# Patient Record
Sex: Female | Born: 1945 | Race: White | Hispanic: No | Marital: Married | Smoking: Never smoker
Health system: Southern US, Community
[De-identification: ages and names within clinical notes are randomized; demographics above are authoritative.]

## PROBLEM LIST (undated history)

## (undated) DIAGNOSIS — H409 Unspecified glaucoma: Secondary | ICD-10-CM

## (undated) DIAGNOSIS — E785 Hyperlipidemia, unspecified: Secondary | ICD-10-CM

## (undated) DIAGNOSIS — I10 Essential (primary) hypertension: Secondary | ICD-10-CM

## (undated) DIAGNOSIS — M1990 Unspecified osteoarthritis, unspecified site: Secondary | ICD-10-CM

## (undated) HISTORY — PX: TONSILLECTOMY: SUR1361

## (undated) HISTORY — PX: APPENDECTOMY: SHX54

## (undated) HISTORY — PX: EYE SURGERY: SHX253

## (undated) HISTORY — PX: ABDOMINAL HYSTERECTOMY: SHX81

---

## 2008-05-10 ENCOUNTER — Ambulatory Visit: Payer: Self-pay | Admitting: Diagnostic Radiology

## 2008-05-10 ENCOUNTER — Emergency Department (HOSPITAL_BASED_OUTPATIENT_CLINIC_OR_DEPARTMENT_OTHER): Admission: EM | Admit: 2008-05-10 | Discharge: 2008-05-10 | Payer: Self-pay | Admitting: Emergency Medicine

## 2013-12-12 ENCOUNTER — Encounter (HOSPITAL_COMMUNITY)
Admission: EM | Disposition: A | Discharge status: Home / Self Care | Payer: Self-pay | Source: Home / Self Care | Attending: Emergency Medicine

## 2013-12-12 ENCOUNTER — Encounter (HOSPITAL_BASED_OUTPATIENT_CLINIC_OR_DEPARTMENT_OTHER): Payer: Self-pay | Admitting: Emergency Medicine

## 2013-12-12 ENCOUNTER — Emergency Department (HOSPITAL_BASED_OUTPATIENT_CLINIC_OR_DEPARTMENT_OTHER): Payer: Medicare Other

## 2013-12-12 ENCOUNTER — Observation Stay (HOSPITAL_BASED_OUTPATIENT_CLINIC_OR_DEPARTMENT_OTHER)
Admission: EM | Admit: 2013-12-12 | Discharge: 2013-12-15 | Disposition: A | Discharge status: Home / Self Care | Payer: Medicare Other | Attending: Orthopedic Surgery | Admitting: Orthopedic Surgery

## 2013-12-12 ENCOUNTER — Inpatient Hospital Stay (HOSPITAL_COMMUNITY): Admission: RE | Admit: 2013-12-12 | Payer: Medicare Other | Source: Ambulatory Visit | Admitting: Orthopedic Surgery

## 2013-12-12 ENCOUNTER — Encounter (HOSPITAL_COMMUNITY): Payer: Medicare Other | Admitting: Certified Registered Nurse Anesthetist

## 2013-12-12 ENCOUNTER — Emergency Department (HOSPITAL_COMMUNITY): Payer: Medicare Other | Admitting: Certified Registered Nurse Anesthetist

## 2013-12-12 ENCOUNTER — Emergency Department (HOSPITAL_COMMUNITY): Payer: Medicare Other

## 2013-12-12 DIAGNOSIS — S42493A Other displaced fracture of lower end of unspecified humerus, initial encounter for closed fracture: Secondary | ICD-10-CM | POA: Diagnosis not present

## 2013-12-12 DIAGNOSIS — S52023A Displaced fracture of olecranon process without intraarticular extension of unspecified ulna, initial encounter for closed fracture: Secondary | ICD-10-CM | POA: Diagnosis present

## 2013-12-12 DIAGNOSIS — Z7982 Long term (current) use of aspirin: Secondary | ICD-10-CM | POA: Diagnosis not present

## 2013-12-12 DIAGNOSIS — Z90710 Acquired absence of both cervix and uterus: Secondary | ICD-10-CM | POA: Diagnosis not present

## 2013-12-12 DIAGNOSIS — W010XXA Fall on same level from slipping, tripping and stumbling without subsequent striking against object, initial encounter: Secondary | ICD-10-CM | POA: Insufficient documentation

## 2013-12-12 DIAGNOSIS — M129 Arthropathy, unspecified: Secondary | ICD-10-CM | POA: Insufficient documentation

## 2013-12-12 DIAGNOSIS — Z79899 Other long term (current) drug therapy: Secondary | ICD-10-CM | POA: Insufficient documentation

## 2013-12-12 DIAGNOSIS — I10 Essential (primary) hypertension: Secondary | ICD-10-CM | POA: Diagnosis not present

## 2013-12-12 DIAGNOSIS — Y9389 Activity, other specified: Secondary | ICD-10-CM | POA: Insufficient documentation

## 2013-12-12 DIAGNOSIS — S42402A Unspecified fracture of lower end of left humerus, initial encounter for closed fracture: Secondary | ICD-10-CM | POA: Diagnosis present

## 2013-12-12 DIAGNOSIS — S53449A Ulnar collateral ligament sprain of unspecified elbow, initial encounter: Secondary | ICD-10-CM | POA: Diagnosis not present

## 2013-12-12 DIAGNOSIS — Y92009 Unspecified place in unspecified non-institutional (private) residence as the place of occurrence of the external cause: Secondary | ICD-10-CM | POA: Diagnosis not present

## 2013-12-12 DIAGNOSIS — Z9089 Acquired absence of other organs: Secondary | ICD-10-CM | POA: Insufficient documentation

## 2013-12-12 DIAGNOSIS — E785 Hyperlipidemia, unspecified: Secondary | ICD-10-CM | POA: Diagnosis not present

## 2013-12-12 DIAGNOSIS — S42452A Displaced fracture of lateral condyle of left humerus, initial encounter for closed fracture: Secondary | ICD-10-CM

## 2013-12-12 DIAGNOSIS — S52022A Displaced fracture of olecranon process without intraarticular extension of left ulna, initial encounter for closed fracture: Secondary | ICD-10-CM

## 2013-12-12 DIAGNOSIS — Z882 Allergy status to sulfonamides status: Secondary | ICD-10-CM | POA: Diagnosis not present

## 2013-12-12 DIAGNOSIS — H409 Unspecified glaucoma: Secondary | ICD-10-CM | POA: Insufficient documentation

## 2013-12-12 DIAGNOSIS — Y998 Other external cause status: Secondary | ICD-10-CM | POA: Insufficient documentation

## 2013-12-12 HISTORY — DX: Unspecified glaucoma: H40.9

## 2013-12-12 HISTORY — DX: Unspecified osteoarthritis, unspecified site: M19.90

## 2013-12-12 HISTORY — PX: ORIF ELBOW FRACTURE: SHX5031

## 2013-12-12 HISTORY — DX: Hyperlipidemia, unspecified: E78.5

## 2013-12-12 HISTORY — DX: Essential (primary) hypertension: I10

## 2013-12-12 LAB — COMPREHENSIVE METABOLIC PANEL
ALT: 15 U/L (ref 0–35)
AST: 31 U/L (ref 0–37)
Albumin: 3.8 g/dL (ref 3.5–5.2)
Alkaline Phosphatase: 74 U/L (ref 39–117)
Anion gap: 13 (ref 5–15)
BILIRUBIN TOTAL: 0.4 mg/dL (ref 0.3–1.2)
BUN: 18 mg/dL (ref 6–23)
CHLORIDE: 96 meq/L (ref 96–112)
CO2: 26 meq/L (ref 19–32)
CREATININE: 0.8 mg/dL (ref 0.50–1.10)
Calcium: 10.1 mg/dL (ref 8.4–10.5)
GFR calc Af Amer: 86 mL/min — ABNORMAL LOW (ref >90)
GFR, EST NON AFRICAN AMERICAN: 74 mL/min — AB (ref >90)
Glucose, Bld: 110 mg/dL — ABNORMAL HIGH (ref 70–99)
Potassium: 4.3 mEq/L (ref 3.7–5.3)
Sodium: 135 mEq/L — ABNORMAL LOW (ref 137–147)
Total Protein: 7.1 g/dL (ref 6.0–8.3)

## 2013-12-12 LAB — CBC WITH DIFFERENTIAL/PLATELET
BASOS ABS: 0 10*3/uL (ref 0.0–0.1)
Basophils Relative: 0 % (ref 0–1)
Eosinophils Absolute: 0.9 10*3/uL — ABNORMAL HIGH (ref 0.0–0.7)
Eosinophils Relative: 9 % — ABNORMAL HIGH (ref 0–5)
HEMATOCRIT: 38 % (ref 36.0–46.0)
HEMOGLOBIN: 12.7 g/dL (ref 12.0–15.0)
LYMPHS ABS: 1.7 10*3/uL (ref 0.7–4.0)
LYMPHS PCT: 17 % (ref 12–46)
MCH: 30.9 pg (ref 26.0–34.0)
MCHC: 33.4 g/dL (ref 30.0–36.0)
MCV: 92.5 fL (ref 78.0–100.0)
MONO ABS: 0.7 10*3/uL (ref 0.1–1.0)
MONOS PCT: 7 % (ref 3–12)
NEUTROS ABS: 7 10*3/uL (ref 1.7–7.7)
Neutrophils Relative %: 67 % (ref 43–77)
Platelets: 261 10*3/uL (ref 150–400)
RBC: 4.11 MIL/uL (ref 3.87–5.11)
RDW: 12.6 % (ref 11.5–15.5)
WBC: 10.3 10*3/uL (ref 4.0–10.5)

## 2013-12-12 LAB — PROTIME-INR
INR: 0.95 (ref 0.00–1.49)
PROTHROMBIN TIME: 12.7 s (ref 11.6–15.2)

## 2013-12-12 SURGERY — OPEN REDUCTION INTERNAL FIXATION (ORIF) ELBOW/OLECRANON FRACTURE
Anesthesia: General | Site: Elbow | Laterality: Left

## 2013-12-12 MED ORDER — MORPHINE SULFATE 4 MG/ML IJ SOLN
4.0000 mg | INTRAMUSCULAR | Status: DC | PRN
  Administered 2013-12-12: 4 mg via INTRAVENOUS
  Filled 2013-12-12: qty 1

## 2013-12-12 MED ORDER — OXYCODONE-ACETAMINOPHEN 5-325 MG PO TABS
2.0000 | ORAL_TABLET | Freq: Once | ORAL | Status: AC
  Administered 2013-12-12: 2 via ORAL
  Filled 2013-12-12: qty 2

## 2013-12-12 MED ORDER — PROPOFOL 10 MG/ML IV BOLUS
INTRAVENOUS | Status: DC | PRN
  Administered 2013-12-12: 120 mg via INTRAVENOUS

## 2013-12-12 MED ORDER — MIDAZOLAM HCL 2 MG/2ML IJ SOLN
INTRAMUSCULAR | Status: DC | PRN
  Administered 2013-12-12: 2 mg via INTRAVENOUS

## 2013-12-12 MED ORDER — EPHEDRINE SULFATE 50 MG/ML IJ SOLN
INTRAMUSCULAR | Status: DC | PRN
  Administered 2013-12-12 (×2): 10 mg via INTRAVENOUS

## 2013-12-12 MED ORDER — FENTANYL CITRATE 0.05 MG/ML IJ SOLN
INTRAMUSCULAR | Status: DC | PRN
  Administered 2013-12-12: 100 ug via INTRAVENOUS
  Administered 2013-12-12: 50 ug via INTRAVENOUS

## 2013-12-12 MED ORDER — MIDAZOLAM HCL 2 MG/2ML IJ SOLN
INTRAMUSCULAR | Status: AC
  Filled 2013-12-12: qty 2

## 2013-12-12 MED ORDER — 0.9 % SODIUM CHLORIDE (POUR BTL) OPTIME
TOPICAL | Status: DC | PRN
  Administered 2013-12-12: 1000 mL

## 2013-12-12 MED ORDER — SUCCINYLCHOLINE CHLORIDE 20 MG/ML IJ SOLN
INTRAMUSCULAR | Status: DC | PRN
  Administered 2013-12-12: 120 mg via INTRAVENOUS

## 2013-12-12 MED ORDER — CEFAZOLIN SODIUM-DEXTROSE 2-3 GM-% IV SOLR
INTRAVENOUS | Status: DC | PRN
  Administered 2013-12-12: 2 g via INTRAVENOUS

## 2013-12-12 MED ORDER — LIDOCAINE HCL (CARDIAC) 20 MG/ML IV SOLN
INTRAVENOUS | Status: DC | PRN
  Administered 2013-12-12: 100 mg via INTRAVENOUS

## 2013-12-12 MED ORDER — BUPIVACAINE-EPINEPHRINE (PF) 0.5% -1:200000 IJ SOLN
INTRAMUSCULAR | Status: DC | PRN
  Administered 2013-12-12: 30 mL via PERINEURAL

## 2013-12-12 MED ORDER — FENTANYL CITRATE 0.05 MG/ML IJ SOLN
INTRAMUSCULAR | Status: AC
  Filled 2013-12-12: qty 5

## 2013-12-12 SURGICAL SUPPLY — 80 items
ANCHOR JUGGERKNOT 1.0 1DR 2-0 (Anchor) ×6 IMPLANT
BANDAGE ELASTIC 3 VELCRO ST LF (GAUZE/BANDAGES/DRESSINGS) ×3 IMPLANT
BANDAGE ELASTIC 4 VELCRO ST LF (GAUZE/BANDAGES/DRESSINGS) ×6 IMPLANT
BIT DRILL 2.0 LNG QUCK RELEASE (BIT) ×1 IMPLANT
BIT DRILL 2.8 QUICK RELEASE (BIT) ×1 IMPLANT
BIT DRILL MICR ACTRK 2 LNG PRF (BIT) ×1 IMPLANT
BIT DRILL MINI LNG ACUTRAK 2 (BIT) ×1 IMPLANT
BNDG COHESIVE 4X5 TAN STRL (GAUZE/BANDAGES/DRESSINGS) ×3 IMPLANT
BNDG ESMARK 4X9 LF (GAUZE/BANDAGES/DRESSINGS) ×3 IMPLANT
BNDG GAUZE ELAST 4 BULKY (GAUZE/BANDAGES/DRESSINGS) ×6 IMPLANT
CORDS BIPOLAR (ELECTRODE) ×3 IMPLANT
COVER MAYO STAND STRL (DRAPES) ×3 IMPLANT
COVER SURGICAL LIGHT HANDLE (MISCELLANEOUS) ×3 IMPLANT
CUFF TOURNIQUET SINGLE 18IN (TOURNIQUET CUFF) ×3 IMPLANT
CUFF TOURNIQUET SINGLE 24IN (TOURNIQUET CUFF) IMPLANT
DRAPE INCISE IOBAN 66X45 STRL (DRAPES) ×3 IMPLANT
DRAPE OEC MINIVIEW 54X84 (DRAPES) IMPLANT
DRILL 2.0 LNG QUICK RELEASE (BIT) ×3
DRILL 2.8 QUICK RELEASE (BIT) ×3
DRILL MICRO ACUTRAK 2 LNG PROF (BIT) ×3
DRILL MINI LNG ACUTRAK 2 (BIT) ×3
DRSG ADAPTIC 3X8 NADH LF (GAUZE/BANDAGES/DRESSINGS) ×3 IMPLANT
EVACUATOR 1/8 PVC DRAIN (DRAIN) ×3 IMPLANT
GAUZE SPONGE 4X4 12PLY STRL (GAUZE/BANDAGES/DRESSINGS) ×3 IMPLANT
GAUZE XEROFORM 1X8 LF (GAUZE/BANDAGES/DRESSINGS) ×3 IMPLANT
GAUZE XEROFORM 5X9 LF (GAUZE/BANDAGES/DRESSINGS) ×3 IMPLANT
GLOVE BIO SURGEON STRL SZ7.5 (GLOVE) ×3 IMPLANT
GLOVE BIOGEL M STRL SZ7.5 (GLOVE) ×3 IMPLANT
GLOVE BIOGEL PI IND STRL 6.5 (GLOVE) ×1 IMPLANT
GLOVE BIOGEL PI INDICATOR 6.5 (GLOVE) ×2
GLOVE ECLIPSE 7.5 STRL STRAW (GLOVE) ×3 IMPLANT
GLOVE SS BIOGEL STRL SZ 8 (GLOVE) ×1 IMPLANT
GLOVE SUPERSENSE BIOGEL SZ 8 (GLOVE) ×2
GOWN STRL REUS W/ TWL LRG LVL3 (GOWN DISPOSABLE) ×2 IMPLANT
GOWN STRL REUS W/ TWL XL LVL3 (GOWN DISPOSABLE) ×3 IMPLANT
GOWN STRL REUS W/TWL LRG LVL3 (GOWN DISPOSABLE) ×4
GOWN STRL REUS W/TWL XL LVL3 (GOWN DISPOSABLE) ×6
GUIDEWIRE ORTH 6X062XTROC NS (WIRE) ×1 IMPLANT
GUIDEWIRE ORTHO MICROSHT  ACUT (WIRE) ×4
GUIDEWIRE ORTHO MICROSHT .035 (WIRE) ×2 IMPLANT
GUIDEWIRE ORTHO MINI ACTK .045 (WIRE) ×3 IMPLANT
K-WIRE .062 (WIRE) ×2
KIT BASIN OR (CUSTOM PROCEDURE TRAY) ×3 IMPLANT
KIT ROOM TURNOVER OR (KITS) ×3 IMPLANT
LOOP VESSEL MAXI BLUE (MISCELLANEOUS) IMPLANT
MANIFOLD NEPTUNE II (INSTRUMENTS) ×3 IMPLANT
NEEDLE HYPO 25GX1X1/2 BEV (NEEDLE) IMPLANT
NS IRRIG 1000ML POUR BTL (IV SOLUTION) ×3 IMPLANT
PACK ORTHO EXTREMITY (CUSTOM PROCEDURE TRAY) ×3 IMPLANT
PAD ARMBOARD 7.5X6 YLW CONV (MISCELLANEOUS) ×6 IMPLANT
PAD CAST 4YDX4 CTTN HI CHSV (CAST SUPPLIES) ×3 IMPLANT
PADDING CAST COTTON 4X4 STRL (CAST SUPPLIES) ×6
PLATE LEFT OLECRANON 3HOLE (Plate) ×3 IMPLANT
PUTTY DBM STAGRAFT PLUS 2CC (Putty) ×3 IMPLANT
SCREW ACUTRAK 2 MICRO 14MM (Screw) ×3 IMPLANT
SCREW ACUTRAK 2 MINI 24MM (Screw) ×3 IMPLANT
SCREW ACUTRAK 2 MINI 26MM (Screw) ×3 IMPLANT
SCREW CORTICAL 3.5X20MM (Screw) ×3 IMPLANT
SCREW HEXALOBE LOCKING 3.5X14M (Screw) ×3 IMPLANT
SCREW LOCK 22X2.7X HEXALOBE (Screw) ×1 IMPLANT
SCREW LOCK 40X3.5X HEXALOBE (Screw) ×1 IMPLANT
SCREW LOCKING 2.7X22MM (Screw) ×2 IMPLANT
SCREW LOCKING 3.5X40 (Screw) ×2 IMPLANT
SCREW LOCKING HEX 3.5X26MM (Screw) ×3 IMPLANT
SCREW NON LOCKING HEX 2.7X20MM (Screw) ×3 IMPLANT
SOLUTION BETADINE 4OZ (MISCELLANEOUS) ×3 IMPLANT
SPECIMEN JAR SMALL (MISCELLANEOUS) ×3 IMPLANT
SPLINT FIBERGLASS 4X30 (CAST SUPPLIES) ×3 IMPLANT
SPONGE GAUZE 4X4 12PLY STER LF (GAUZE/BANDAGES/DRESSINGS) ×3 IMPLANT
SPONGE LAP 18X18 X RAY DECT (DISPOSABLE) ×3 IMPLANT
SPONGE SCRUB IODOPHOR (GAUZE/BANDAGES/DRESSINGS) ×3 IMPLANT
SUT PROLENE 3 0 PS 2 (SUTURE) ×12 IMPLANT
SUT VIC AB 3-0 FS2 27 (SUTURE) ×6 IMPLANT
SYR CONTROL 10ML LL (SYRINGE) IMPLANT
TOWEL OR 17X24 6PK STRL BLUE (TOWEL DISPOSABLE) ×3 IMPLANT
TOWEL OR 17X26 10 PK STRL BLUE (TOWEL DISPOSABLE) ×9 IMPLANT
TUBE CONNECTING 12'X1/4 (SUCTIONS)
TUBE CONNECTING 12X1/4 (SUCTIONS) IMPLANT
UNDERPAD 30X30 INCONTINENT (UNDERPADS AND DIAPERS) ×3 IMPLANT
WATER STERILE IRR 1000ML POUR (IV SOLUTION) ×3 IMPLANT

## 2013-12-12 NOTE — ED Notes (Signed)
Pt c/o fall from standing landing on concrete injuring left elbow x 30 mins ago.

## 2013-12-12 NOTE — ED Notes (Signed)
Dr Gramig at bedside. 

## 2013-12-12 NOTE — ED Notes (Signed)
Report given to Charge RN at [facility] ED

## 2013-12-12 NOTE — H&P (Signed)
Deanna Compton is an 68 y.o. female.   Chief Complaint: Patient presents with a left elbow comminuted complex fracture  HPI: Patient presents with a comminuted left elbow fracture with displacement. She denies other complaints. This was a same level fall today. She denies locking popping catching numbness or tingling in the wrist or shoulder. She denies prior injury to the elbow. She denies lower extremity pain.  She specifically denies neck back chest or abdominal pain. She is here with her husband. She is a healthy female for the most part.  Past Medical History  Diagnosis Date  . Hyperlipemia   . Arthritis   . Glaucoma   . Hypertension     Past Surgical History  Procedure Laterality Date  . Appendectomy    . Tonsillectomy    . Abdominal hysterectomy    . Eye surgery      History reviewed. No pertinent family history. Social History:  reports that she has never smoked. She does not have any smokeless tobacco history on file. She reports that she does not drink alcohol or use illicit drugs.  Allergies:  Allergies  Allergen Reactions  . Sulfa Antibiotics Other (See Comments)    Childhood allergy.    Medications Prior to Admission  Medication Sig Dispense Refill  . aspirin 325 MG tablet Take 325 mg by mouth daily.      Marland Kitchen atorvastatin (LIPITOR) 40 MG tablet Take 40 mg by mouth daily.      . bimatoprost (LUMIGAN) 0.03 % ophthalmic solution Place 1 drop into both eyes at bedtime.       . calcium carbonate (OS-CAL - DOSED IN MG OF ELEMENTAL CALCIUM) 1250 MG tablet Take 1 tablet by mouth daily with breakfast.      . cholecalciferol (VITAMIN D) 1000 UNITS tablet Take 1,000 Units by mouth daily.      . dorzolamide-timolol (COSOPT) 22.3-6.8 MG/ML ophthalmic solution Place 1 drop into both eyes 2 (two) times daily.      . Estradiol (VAGIFEM) 10 MCG TABS vaginal tablet Place 1 tablet vaginally every Monday, Wednesday, and Friday.      . meloxicam (MOBIC) 7.5 MG tablet Take 15 mg by mouth  daily.       . Multiple Vitamin (MULTIVITAMIN WITH MINERALS) TABS tablet Take 1 tablet by mouth daily.      Marland Kitchen omega-3 acid ethyl esters (LOVAZA) 1 G capsule Take 1 g by mouth 3 (three) times daily.      . polyethylene glycol (MIRALAX / GLYCOLAX) packet Take 17 g by mouth daily.      . solifenacin (VESICARE) 5 MG tablet Take 2.5 mg by mouth daily.       . TURMERIC CURCUMIN PO Take 1 capsule by mouth 3 (three) times daily.        Results for orders placed during the hospital encounter of 12/12/13 (from the past 48 hour(s))  CBC WITH DIFFERENTIAL     Status: Abnormal   Collection Time    12/12/13  3:55 PM      Result Value Ref Range   WBC 10.3  4.0 - 10.5 K/uL   RBC 4.11  3.87 - 5.11 MIL/uL   Hemoglobin 12.7  12.0 - 15.0 g/dL   HCT 38.0  36.0 - 46.0 %   MCV 92.5  78.0 - 100.0 fL   MCH 30.9  26.0 - 34.0 pg   MCHC 33.4  30.0 - 36.0 g/dL   RDW 12.6  11.5 - 15.5 %   Platelets  261  150 - 400 K/uL   Neutrophils Relative % 67  43 - 77 %   Neutro Abs 7.0  1.7 - 7.7 K/uL   Lymphocytes Relative 17  12 - 46 %   Lymphs Abs 1.7  0.7 - 4.0 K/uL   Monocytes Relative 7  3 - 12 %   Monocytes Absolute 0.7  0.1 - 1.0 K/uL   Eosinophils Relative 9 (*) 0 - 5 %   Eosinophils Absolute 0.9 (*) 0.0 - 0.7 K/uL   Basophils Relative 0  0 - 1 %   Basophils Absolute 0.0  0.0 - 0.1 K/uL  COMPREHENSIVE METABOLIC PANEL     Status: Abnormal   Collection Time    12/12/13  3:55 PM      Result Value Ref Range   Sodium 135 (*) 137 - 147 mEq/L   Potassium 4.3  3.7 - 5.3 mEq/L   Chloride 96  96 - 112 mEq/L   CO2 26  19 - 32 mEq/L   Glucose, Bld 110 (*) 70 - 99 mg/dL   BUN 18  6 - 23 mg/dL   Creatinine, Ser 0.80  0.50 - 1.10 mg/dL   Calcium 10.1  8.4 - 10.5 mg/dL   Total Protein 7.1  6.0 - 8.3 g/dL   Albumin 3.8  3.5 - 5.2 g/dL   AST 31  0 - 37 U/L   ALT 15  0 - 35 U/L   Alkaline Phosphatase 74  39 - 117 U/L   Total Bilirubin 0.4  0.3 - 1.2 mg/dL   GFR calc non Af Amer 74 (*) >90 mL/min   GFR calc Af Amer 86  (*) >90 mL/min   Comment: (NOTE)     The eGFR has been calculated using the CKD EPI equation.     This calculation has not been validated in all clinical situations.     eGFR's persistently <90 mL/min signify possible Chronic Kidney     Disease.   Anion gap 13  5 - 15  PROTIME-INR     Status: None   Collection Time    12/12/13  3:55 PM      Result Value Ref Range   Prothrombin Time 12.7  11.6 - 15.2 seconds   INR 0.95  0.00 - 1.49   Dg Chest 1 View  12/12/2013   CLINICAL DATA:  Preoperative evaluation.  EXAM: CHEST - 1 VIEW  COMPARISON:  None.  FINDINGS: The cardiac silhouette appears enlarged, mediastinal silhouette is unremarkable. No pleural effusions. Strandy densities left lung base. No pneumothorax. Soft tissue planes and included osseous structures are nonsuspicious.  IMPRESSION: Mild cardiomegaly.  Left lung base atelectasis.   Electronically Signed   By: Elon Alas   On: 12/12/2013 19:59   Dg Elbow Complete Left  12/12/2013   CLINICAL DATA:  Status post fall.  Posterior elbow pain.  EXAM: LEFT ELBOW - COMPLETE 3+ VIEW  COMPARISON:  None.  FINDINGS: The patient has a minimally distracted fracture of the olecranon. There is also a fracture of the capitellum which is superiorly displaced along the distal humerus approximately 1.5 cm. Elbow joint effusion is noted.  IMPRESSION: Acute, mildly distracted olecranon fracture and superiorly displaced capitellum fracture.   Electronically Signed   By: Inge Rise M.D.   On: 12/12/2013 14:15    Review of Systems  Constitutional: Negative.   Eyes: Negative.   Respiratory: Negative.   Cardiovascular: Negative.   Gastrointestinal: Negative.   Genitourinary: Negative.  Neurological: Negative.   Psychiatric/Behavioral: Negative.     Blood pressure 127/57, pulse 55, temperature 98.3 F (36.8 C), temperature source Oral, resp. rate 12, height 5' 3"  (1.6 m), weight 63.504 kg (140 lb), SpO2 96.00%. Physical Exam  White female alert  and oriented. Swollen elbow. She has pain in the elbow appropriate to her injury.  She has intact pulse and refill to the hand. She has intact sensation to the hand and can extend her fingers. There is no signs of obvious compartment syndrome at this juncture.  The patient's neck and back are nontender.  The patient is alert and oriented in no acute distress the patient complains of pain in the affected upper extremity.  The patient is noted to have a normal HEENT exam.  Lung fields show equal chest expansion and no shortness of breath  abdomen exam is nontender without distention.  Lower extremity examination does not show any fracture dislocation or blood clot symptoms.  Pelvis is stable neck and back are stable and nontender   Assessment/Plan Comminuted left elbow fracture with capitellum trochlea and olecranon injuries  I would recommend open reduction internal fixation with repair reconstruction is necessary. I discussed the patient the risk and benefits duties and don'ts and timeframe duration of recovery.  I have discussed with the patient the plans.  Unfortunately this is a devastating left elbow injury. We're going to do our best to find functional range of motion in terms of her outcome and bony healing.  I discussed the patient all issues risk and benefits and she desires to proceed. I personally reviewed the x-rays at great length and discussed she and her husband the relevant issues and pathophysiology  We are planning surgery for your upper extremity. The risk and benefits of surgery include risk of bleeding infection anesthesia damage to normal structures and failure of the surgery to accomplish its intended goals of relieving symptoms and restoring function with this in mind we'll going to proceed. I have specifically discussed with the patient the pre-and postoperative regime and the does and don'ts and risk and benefits in great detail. Risk and benefits of surgery also  include risk of dystrophy chronic nerve pain failure of the healing process to go onto completion and other inherent risks of surgery The relavent the pathophysiology of the disease/injury process, as well as the alternatives for treatment and postoperative course of action has been discussed in great detail with the patient who desires to proceed.  We will do everything in our power to help you (the patient) restore function to the upper extremity. Is a pleasure to see this patient today.   Paulene Floor 12/12/2013, 8:56 PM

## 2013-12-12 NOTE — ED Notes (Signed)
Dr. Amanda Pea called to inform of patient arrival.

## 2013-12-12 NOTE — ED Notes (Signed)
Dr Silverio Lay aware unable to start SL, but blood was drawn. Dr Silverio Lay states okay to leave IV out for now,

## 2013-12-12 NOTE — ED Provider Notes (Signed)
CSN: [id number]     Arrival date & time 12/12/13  1349 History   First MD Initiated Contact with Patient 12/12/13 1501     Chief Complaint  Patient presents with  . Arm Injury     (Consider location/radiation/quality/duration/timing/severity/associated sxs/prior Treatment) The history is provided by the patient.  Deanna Compton is a 68 y.o. female hx HL, HTN here with fall. She was carrying groceries today and her foot got caught and she fell and hit left elbow. Denies head injury or other injuries. Was unable to move L elbow afterwards. Denies other extremity pain or back pain. Not on blood thinners.     Past Medical History  Diagnosis Date  . Hyperlipemia   . Arthritis   . Glaucoma   . Hypertension    Past Surgical History  Procedure Laterality Date  . Appendectomy    . Tonsillectomy    . Abdominal hysterectomy    . Eye surgery     History reviewed. No pertinent family history. History  Substance Use Topics  . Smoking status: Never Smoker   . Smokeless tobacco: Not on file  . Alcohol Use: No   OB History   Grav Para Term Preterm Abortions TAB SAB Ect Mult Living                 Review of Systems  Musculoskeletal:       L elbow pain   All other systems reviewed and are negative.     Allergies  Sulfa antibiotics  Home Medications   Prior to Admission medications   Medication Sig Start Date End Date Taking? Authorizing Provider  aspirin 325 MG tablet Take 325 mg by mouth daily.   Yes Historical Provider, MD  atorvastatin (LIPITOR) 40 MG tablet Take 40 mg by mouth daily.   Yes Historical Provider, MD  bimatoprost (LUMIGAN) 0.03 % ophthalmic solution 1 drop at bedtime.   Yes Historical Provider, MD  meloxicam (MOBIC) 7.5 MG tablet Take 7.5 mg by mouth daily.   Yes Historical Provider, MD  pseudoephedrine-acetaminophen (TYLENOL SINUS) 30-500 MG TABS Take 1 tablet by mouth every 4 (four) hours as needed.   Yes Historical Provider, MD  solifenacin (VESICARE) 5 MG  tablet Take 5 mg by mouth daily.   Yes Historical Provider, MD  timolol (BETIMOL) 0.5 % ophthalmic solution 1 drop 2 (two) times daily.   Yes Historical Provider, MD   BP 153/85  Pulse 63  Temp(Src) 98.1 F (36.7 C)  Resp 16  Ht  (1.6 m)  Wt 140 lb (63.504 kg)  BMI 24.81 kg/m2  SpO2 98% Physical Exam  Nursing note and vitals reviewed. Constitutional: She is oriented to person, place, and time. She appears well-nourished.  Uncomfortable   HENT:  Head: Normocephalic and atraumatic.  Eyes: Conjunctivae are normal. Pupils are equal, round, and reactive to light.  Neck: Normal range of motion. Neck supple.  Cardiovascular: Normal rate.   Pulmonary/Chest: Effort normal.  Abdominal: Soft.  Musculoskeletal: Normal range of motion.  L elbow slightly deformed, dec ROM. Tenderness on the elbow. No shoulder or humerus or distal forearm tenderness. Nl ROM wrist, nl hand grasp, nl capillary refill. 2+ pulses   Neurological: She is alert and oriented to person, place, and time.  Skin: Skin is warm.  Psychiatric: She has a normal mood and affect. Her behavior is normal. Judgment and thought content normal.    ED Course  Procedures (including critical care time) Labs Review Labs Reviewed - No data  to display  Imaging Review Dg Elbow Complete Left  12/12/2013   CLINICAL DATA:  Status post fall.  Posterior elbow pain.  EXAM: LEFT ELBOW - COMPLETE 3+ VIEW  COMPARISON:  None.  FINDINGS: The patient has a minimally distracted fracture of the olecranon. There is also a fracture of the capitellum which is superiorly displaced along the distal humerus approximately 1.5 cm. Elbow joint effusion is noted.  IMPRESSION: Acute, mildly distracted olecranon fracture and superiorly displaced capitellum fracture.   Electronically Signed   By: Drusilla Kanner M.D.   On: 12/12/2013 14:15     EKG Interpretation None      MDM   Final diagnoses:  Olecranon fracture, left, closed, initial encounter   Closed fracture of capitellum of distal humerus, left, initial encounter    Deanna Compton is a 68 y.o. female here with fall. Xray elbow showed olecranon fracture and capitellum fracture. She said that Dr. Amanda Pea is her hand surgeon. We called Dr. Amanda Pea, who plans on doing surgery today. Request transfer to [facility]. Dr. Patria Mane in the ED made aware. Preop labs drawn. Place in splint and shoulder immobilizer.    Richardean Canal, MD 12/12/13 [phone]

## 2013-12-12 NOTE — Anesthesia Procedure Notes (Addendum)
Anesthesia Regional Block:  Supraclavicular block  Pre-Anesthetic Checklist: ,, timeout performed, Correct Patient, Correct Site, Correct Laterality, Correct Procedure, Correct Position, site marked, Risks and benefits discussed,  Surgical consent,  Pre-op evaluation,  At surgeon's request and post-op pain management  Laterality: Left  Prep: chloraprep       Needles:  Injection technique: Single-shot  Needle Type: Echogenic Stimulator Needle     Needle Length: 9cm 9 cm Needle Gauge: 21 and 21 G    Additional Needles:  Procedures: ultrasound guided (picture in chart) and nerve stimulator Supraclavicular block  Nerve Stimulator or Paresthesia:  Response: 0.4 mA,   Additional Responses:   Narrative:  Start time: 12/12/2013 8:30 PM End time: 12/12/2013 8:40 PM Injection made incrementally with aspirations every 5 mL.  Performed by: Personally  Anesthesiologist: Arta Bruce MD  Additional Notes: Monitors applied. Patient sedated. Sterile prep and drape,hand hygiene and sterile gloves were used. Relevant anatomy identified.Needle position confirmed.Local anesthetic injected incrementally after negative aspiration. Local anesthetic spread visualized around nerve(s). Vascular puncture avoided. No complications. Image printed for medical record.The patient tolerated the procedure well.        Procedure Name: Intubation Date/Time: 12/12/2013 10:38 PM Performed by: Molli Hazard Pre-anesthesia Checklist: Patient identified, Emergency Drugs available, Suction available and Patient being monitored Patient Re-evaluated:Patient Re-evaluated prior to inductionOxygen Delivery Method: Circle system utilized Preoxygenation: Pre-oxygenation with 100% oxygen Intubation Type: IV induction, Rapid sequence and Cricoid Pressure applied Laryngoscope Size: Miller and 2 Grade View: Grade I Tube type: Oral Tube size: 7.5 mm Number of attempts: 1 Airway Equipment and Method: Stylet Placement  Confirmation: ETT inserted through vocal cords under direct vision,  positive ETCO2 and breath sounds checked- equal and bilateral Secured at: 22 cm Tube secured with: Tape Dental Injury: Teeth and Oropharynx as per pre-operative assessment

## 2013-12-12 NOTE — Anesthesia Preprocedure Evaluation (Addendum)
Anesthesia Evaluation  Patient identified by MRN, date of birth, ID band Patient awake    Reviewed: Allergy & Precautions, H&P , NPO status , Patient's Chart, lab work & pertinent test results  Airway Mallampati: I TM Distance: >3 FB Neck ROM: Full    Dental  (+) Teeth Intact, Dental Advisory Given   Pulmonary          Cardiovascular hypertension,     Neuro/Psych    GI/Hepatic   Endo/Other    Renal/GU      Musculoskeletal   Abdominal   Peds  Hematology   Anesthesia Other Findings   Reproductive/Obstetrics                          Anesthesia Physical Anesthesia Plan  ASA: II  Anesthesia Plan: General   Post-op Pain Management:    Induction: Intravenous  Airway Management Planned: Oral ETT  Additional Equipment:   Intra-op Plan:   Post-operative Plan: Extubation in OR  Informed Consent:   Dental advisory given  Plan Discussed with: Surgeon and CRNA  Anesthesia Plan Comments:        Anesthesia Quick Evaluation

## 2013-12-12 NOTE — ED Provider Notes (Signed)
On arrival from our affiliated facility the patient is in no distress, pain is well-controlled with previously provided medication.  Specialist is aware.  Gerhard Munch, MD 12/12/13 [phone]

## 2013-12-12 NOTE — ED Notes (Signed)
Notified Dr. Amanda Pea of patient's arrival

## 2013-12-13 DIAGNOSIS — S42402A Unspecified fracture of lower end of left humerus, initial encounter for closed fracture: Secondary | ICD-10-CM | POA: Diagnosis present

## 2013-12-13 MED ORDER — MEPERIDINE HCL 25 MG/ML IJ SOLN: 6.2500 mg | INTRAMUSCULAR | Status: DC | PRN

## 2013-12-13 MED ORDER — CEFAZOLIN SODIUM 1-5 GM-% IV SOLN
1.0000 g | INTRAVENOUS | Status: AC
  Administered 2013-12-13: 1 g via INTRAVENOUS
  Filled 2013-12-13: qty 50

## 2013-12-13 MED ORDER — ONDANSETRON HCL 4 MG/2ML IJ SOLN
INTRAMUSCULAR | Status: DC | PRN
  Administered 2013-12-13: 4 mg via INTRAVENOUS

## 2013-12-13 MED ORDER — METHOCARBAMOL 500 MG PO TABS
500.0000 mg | ORAL_TABLET | Freq: Four times a day (QID) | ORAL | Status: DC | PRN
  Administered 2013-12-13 – 2013-12-15 (×6): 500 mg via ORAL
  Filled 2013-12-13 (×6): qty 1

## 2013-12-13 MED ORDER — FAMOTIDINE 20 MG PO TABS: 20.0000 mg | ORAL_TABLET | Freq: Two times a day (BID) | ORAL | Status: DC | PRN

## 2013-12-13 MED ORDER — ASPIRIN 325 MG PO TABS
325.0000 mg | ORAL_TABLET | Freq: Every day | ORAL | Status: DC
  Administered 2013-12-13 – 2013-12-15 (×3): 325 mg via ORAL
  Filled 2013-12-13 (×3): qty 1

## 2013-12-13 MED ORDER — ONDANSETRON HCL 4 MG/2ML IJ SOLN
4.0000 mg | Freq: Four times a day (QID) | INTRAMUSCULAR | Status: DC | PRN
  Administered 2013-12-14 – 2013-12-15 (×3): 4 mg via INTRAVENOUS
  Filled 2013-12-13 (×3): qty 2

## 2013-12-13 MED ORDER — DIPHENHYDRAMINE HCL 50 MG/ML IJ SOLN
INTRAMUSCULAR | Status: DC | PRN
  Administered 2013-12-13: 12.5 mg via INTRAVENOUS

## 2013-12-13 MED ORDER — ONDANSETRON HCL 4 MG/2ML IJ SOLN: 4.0000 mg | Freq: Once | INTRAMUSCULAR | Status: DC | PRN

## 2013-12-13 MED ORDER — OXYCODONE HCL 5 MG PO TABS
5.0000 mg | ORAL_TABLET | ORAL | Status: DC | PRN
  Administered 2013-12-13: 5 mg via ORAL
  Administered 2013-12-13: 10 mg via ORAL
  Administered 2013-12-13: 5 mg via ORAL
  Administered 2013-12-14: 10 mg via ORAL
  Administered 2013-12-14: 5 mg via ORAL
  Administered 2013-12-14 – 2013-12-15 (×4): 10 mg via ORAL
  Filled 2013-12-13: qty 1
  Filled 2013-12-13 (×3): qty 2
  Filled 2013-12-13: qty 1
  Filled 2013-12-13 (×2): qty 2
  Filled 2013-12-13: qty 1
  Filled 2013-12-13: qty 2

## 2013-12-13 MED ORDER — METHOCARBAMOL 1000 MG/10ML IJ SOLN: 500.0000 mg | Freq: Four times a day (QID) | INTRAVENOUS | Status: DC | PRN

## 2013-12-13 MED ORDER — HYDROMORPHONE HCL PF 1 MG/ML IJ SOLN: 0.2500 mg | INTRAMUSCULAR | Status: DC | PRN

## 2013-12-13 MED ORDER — MORPHINE SULFATE 4 MG/ML IJ SOLN
4.0000 mg | INTRAMUSCULAR | Status: DC | PRN
  Administered 2013-12-13 – 2013-12-14 (×4): 4 mg via INTRAVENOUS
  Filled 2013-12-13 (×4): qty 1

## 2013-12-13 MED ORDER — PROMETHAZINE HCL 25 MG RE SUPP
12.5000 mg | Freq: Four times a day (QID) | RECTAL | Status: DC | PRN
  Administered 2013-12-14: 12.5 mg via RECTAL
  Filled 2013-12-13: qty 1

## 2013-12-13 MED ORDER — OXYCODONE HCL 5 MG/5ML PO SOLN: 5.0000 mg | Freq: Once | ORAL | Status: DC | PRN

## 2013-12-13 MED ORDER — LACTATED RINGERS IV SOLN
INTRAVENOUS | Status: DC
  Administered 2013-12-13 – 2013-12-15 (×4): via INTRAVENOUS

## 2013-12-13 MED ORDER — ATORVASTATIN CALCIUM 40 MG PO TABS
40.0000 mg | ORAL_TABLET | Freq: Every day | ORAL | Status: DC
  Administered 2013-12-13 – 2013-12-15 (×3): 40 mg via ORAL
  Filled 2013-12-13 (×3): qty 1

## 2013-12-13 MED ORDER — ONDANSETRON HCL 4 MG PO TABS: 4.0000 mg | ORAL_TABLET | Freq: Four times a day (QID) | ORAL | Status: DC | PRN

## 2013-12-13 MED ORDER — DEXAMETHASONE SODIUM PHOSPHATE 10 MG/ML IJ SOLN
INTRAMUSCULAR | Status: DC | PRN
  Administered 2013-12-13: 4 mg via INTRAVENOUS

## 2013-12-13 MED ORDER — VITAMIN C 500 MG PO TABS
1000.0000 mg | ORAL_TABLET | Freq: Every day | ORAL | Status: DC
  Administered 2013-12-13 – 2013-12-15 (×3): 1000 mg via ORAL
  Filled 2013-12-13 (×3): qty 2

## 2013-12-13 MED ORDER — ALPRAZOLAM 0.5 MG PO TABS: 0.5000 mg | ORAL_TABLET | Freq: Four times a day (QID) | ORAL | Status: DC | PRN

## 2013-12-13 MED ORDER — OXYCODONE HCL 5 MG PO TABS: 5.0000 mg | ORAL_TABLET | Freq: Once | ORAL | Status: DC | PRN

## 2013-12-13 MED ORDER — DOCUSATE SODIUM 100 MG PO CAPS
100.0000 mg | ORAL_CAPSULE | Freq: Two times a day (BID) | ORAL | Status: DC
  Administered 2013-12-13 – 2013-12-15 (×5): 100 mg via ORAL
  Filled 2013-12-13 (×5): qty 1

## 2013-12-13 MED ORDER — LACTATED RINGERS IV SOLN
INTRAVENOUS | Status: DC | PRN
  Administered 2013-12-12 – 2013-12-13 (×2): via INTRAVENOUS

## 2013-12-13 MED ORDER — CEFAZOLIN SODIUM 1-5 GM-% IV SOLN
1.0000 g | Freq: Three times a day (TID) | INTRAVENOUS | Status: DC
  Administered 2013-12-13 – 2013-12-15 (×7): 1 g via INTRAVENOUS
  Filled 2013-12-13 (×12): qty 50

## 2013-12-13 NOTE — Progress Notes (Signed)
UR Completed.  Deanna Compton Jane [phone] 12/13/2013  

## 2013-12-13 NOTE — Progress Notes (Signed)
Occupational Therapy Evaluation Patient Details Name: Deanna Compton MRN: [id number] DOB: Jun 07, 1945 Today's Date: 12/13/2013    History of Present Illness Deanna Compton is a 68 y.o. female s/p ORIF distal humerus and olecranon fxs; Left elbow ulnar nerve release, and Lateral ulnar collateral ligament reconstruction following a fall at home.    Clinical Impression   PTA pt lived at home and was independent with ADLs and functional mobility. Pt currently requires assist for UB ADLs due to Lt long arm splint and presence of nerve block. Education and training completed with pt on compensatory techniques for UB ADLs and AAROM for Lt hand and shoulder. Pt would benefit from skilled OT to increase independence with UB dressing and sling management as well as AAROM prior to d/c.     Follow Up Recommendations  No OT follow up (MD may want pt to complete rehab after follow up)   Equipment Recommendations  None recommended by OT    Recommendations for Other Services       Precautions / Restrictions Precautions Required Braces or Orthoses: Other Brace/Splint Other Brace/Splint: Let long arm splint and bandaging mid humerus to MCPs Restrictions Weight Bearing Restrictions: No      Mobility Bed Mobility Overal bed mobility: Modified Independent             General bed mobility comments: requires increased time and used RUE to assist with LUE management.  Transfers Overall transfer level: Needs assistance Equipment used: None Transfers: Sit to/from Stand Sit to Stand: Supervision         General transfer comment: Supervision for safety; no c/o dizziness    Balance Overall balance assessment: No apparent balance deficits (not formally assessed)                                          ADL Overall ADL's : Needs assistance/impaired Eating/Feeding: Set up;Sitting Eating/Feeding Details (indicate cue type and reason): assist to open containers Grooming:  Standing;Supervision/safety   Upper Body Bathing: Minimal assitance;Sitting   Lower Body Bathing: Supervison/ safety;Sit to/from stand   Upper Body Dressing : Moderate assistance;Sitting   Lower Body Dressing: Supervision/safety;Sit to/from stand   Toilet Transfer: Supervision/safety;Ambulation   Toileting- Clothing Manipulation and Hygiene: Supervision/safety;Sit to/from stand   Tub/ Shower Transfer: Walk-in shower;Supervision/safety;Ambulation   Functional mobility during ADLs: Supervision/safety General ADL Comments: Pt with good mobility and no c/o dizziness with mobility. Pt requires assist for UB ADLs and educated on compensatory techniques. Pt requires assist to don and position sling. Pt requires higher level of assist likely due to presence of nerve block in LUE. Educated pt on reducing fall risk at home, sitting for LB ADLs, and AAROM exercises for hand and shoulder as tolerated.      Vision        No apparent visual deficits.              Perception Perception Perception Tested?: No   Praxis Praxis Praxis tested?: Within functional limits    Pertinent Vitals/Pain Pain Assessment: No/denies pain     Hand Dominance Right   Extremity/Trunk Assessment Upper Extremity Assessment Upper Extremity Assessment: LUE deficits/detail LUE Deficits / Details: Lt long arm splint from mid humerus to MCPs LUE: Unable to fully assess due to immobilization (pt also reports numbness; nerve block still working) LUE Sensation: decreased light touch (due to nerve block) LUE Coordination: decreased fine motor;decreased gross  motor   Lower Extremity Assessment Lower Extremity Assessment: Overall WFL for tasks assessed   Cervical / Trunk Assessment Cervical / Trunk Assessment: Normal   Communication Communication Communication: No difficulties   Cognition Arousal/Alertness: Awake/alert Behavior During Therapy: WFL for tasks assessed/performed Overall Cognitive Status:  Within Functional Limits for tasks assessed                        Exercises Exercises: Other exercises Other Exercises Other Exercises: Educated pt on AAROM of Lt shoulder and fingers to prevent stiffness. Pt returned demonstration and discussed how husband can assist. Provided with written handout per pt request.         Home Living Family/patient expects to be discharged to:: Private residence Living Arrangements: Spouse/significant other Available Help at Discharge: Family;Available 24 hours/day Type of Home: House Home Access: Stairs to enter [employer] of Steps: 5-6 Entrance Stairs-Rails: Left Home Layout: Two level     Bathroom Shower/Tub: Producer, television/film/video: Standard     Home Equipment: Hand held shower head;Shower seat - built in          Prior Functioning/Environment Level of Independence: Independent             OT Diagnosis: Acute pain;Other (comment) (UE injury)   OT Problem List: Decreased range of motion;Decreased coordination;Impaired UE functional use   OT Treatment/Interventions: Self-care/ADL training;Therapeutic exercise;Therapeutic activities;Patient/family education;Balance training    OT Goals(Current goals can be found in the care plan section) Acute Rehab OT Goals Patient Stated Goal: to go home OT Goal Formulation: With patient Time For Goal Achievement: 12/20/13 Potential to Achieve Goals: Good ADL Goals Pt Will Perform Upper Body Dressing: with modified independence;sitting Pt/caregiver will Perform Home Exercise Program: Increased ROM;Left upper extremity;With Supervision Additional ADL Goal #1: Pt/caregiver will be Independent in donning/doffing sling.   OT Frequency: Min 1X/week              End of Session Equipment Utilized During Treatment: Other (comment) (sling)  Activity Tolerance: Patient tolerated treatment well Patient left: in bed;with call bell/phone within reach   Time:  1610-9604 OT Time Calculation (min): 50 min Charges:  OT General Charges $OT Visit: 1 Procedure OT Evaluation $Initial OT Evaluation Tier I: 1 Procedure OT Treatments $Self Care/Home Management : 23-37 mins $Therapeutic Exercise: 8-22 mins G-Codes: OT G-codes **NOT FOR INPATIENT CLASS** Functional Assessment Tool Used: clinical judgement Functional Limitation: Self care Self Care Current Status (V4098): At least 1 percent but less than 20 percent impaired, limited or restricted Self Care Goal Status (J1914): At least 1 percent but less than 20 percent impaired, limited or restricted  Rae Lips [phone] 12/13/2013, 11:23 AM

## 2013-12-13 NOTE — Op Note (Signed)
NAMEBELLAROSE, BURTT NO.:  [id number]  MEDICAL RECORD NO.:  [id number]  LOCATION:  [facility]                         FACILITY:  [facility]  PHYSICIAN:  Dionne Ano. Panfilo Ketchum, M.D.DATE OF BIRTH:  [birth year]  DATE OF PROCEDURE:  12/12/2013 DATE OF DISCHARGE:                              OPERATIVE REPORT   PREOPERATIVE DIAGNOSES:  Left elbow comminuted complex fracture dislocation with distal humerus intra-articular fracture, olecranon fracture, collateral ligament injury.  POSTOPERATIVE DIAGNOSES:  Left elbow comminuted complex fracture dislocation with distal humerus intra-articular fracture, olecranon fracture, collateral ligament injury.  PROCEDURE: 1. Evaluation under anesthesia, left elbow. 2. Left elbow ulnar nerve release extensive in nature.  This was an in     situ release. 3. Open reduction and internal fixation, distal humerus fracture,     intra-articular involving the capitellum and trochlea. 4. Lateral ulnar collateral ligament reconstruction, left elbow with     suture anchor fixation. 5. Open reduction and internal fixation, olecranon fracture with     Acumed plate left elbow. 6. AP, lateral, and oblique x-rays performed, examined, and     interpreted by myself, left elbow.  SURGEON:  Dionne Ano. Amanda Pea, M.D.  ASSISTANT:  Karie Chimera, P.A.-C.  COMPLICATION:  None.  ANESTHESIA:  General with preoperative block.  TOURNIQUET TIME:  Less than 2 hours.  DRAINS:  One.  INDICATIONS:  A pleasant female fell today, severe injury with disarray of the soft tissue bone and double level injury including olecranon and a shearing injury about the capitellum and trochlea.  She understands risks and benefits and desires to proceed.  OPERATIVE PROCEDURE:  She was given a block by Dr. Marquis Lunch, this was an excellent block.  She was taken to the procedural suite, underwent general anesthetic.  Laid in the modified floppy lateral position, well padded, and  following this, the arm was prepped and draped in a sterile fashion with Betadine scrub and paint.  This was 10 minutes surgical Betadine scrub, final time-out was called.  Pre and postop check was complete.  Collier Flowers was placed.  Curvilinear incision was made after placement and insufflation of the tourniquet to 250 mmHg.  Once this was done, dissection was carried down.  Skin flaps were elevated.  The ulnar nerve was identified.  A marked amount of hematoma and thus I performed a release of the ulnar nerve about the arcade of Struthers, medial intermuscular septum, 2 heads of the FCU, Osborne's ligament, and the cubital tunnel.  This was very carefully protected at all times.  Following this, we then took down the fractured olecranon.  It was slightly comminuted.  She was reflected back to gain access to the distal humerus.  I very carefully teased the injured lateral collateral ligament off of the epicondylar region laterally and this allowed for access to the fracture about the capitellum and trochlea.  We very aggressively debrided this with irrigation, curette, and other orthopedic instruments.  Following this, we then retrieved the capitellum and trochlear piece which were sheared off and performed fixation with 3 Acutrak screws.  The patient tolerated this well.  Two mini and 1 micro Acutrak screw were used for fixation purposes.  This achieved excellent fixation.  I did place some bone graft in the distal humerus where there was a small void over the most posterior aspect of the capitellum.  Overall, fixation was good.  The patient looked good and interdigitated nicely in terms of cartilaginous integrity.  Once this was completed, I pre-placed 2 juggernaut for collateral ligament repair about the lateral aspect of the elbow.  Following this, we then very carefully prepared the olecranon and applied an Acumed plate in standard fashion.  The fracture was reduced after curettage  irrigation and other measures.  The Acumed plate was placed without difficulty and this was a separate and distinct portion of the procedure which was an open reduction and internal fixation of an olecranon fracture.  AP, lateral, oblique x-rays were taken of the distal humerus ORIF as well as the olecranon over ORIF.  Following this, the lateral ulnar collateral ligament was repaired with JuggerKnot Fixation and imbricated with these sutures.  The patient tolerated this well.  There were no complicating features.  Once this was complete, we irrigated copiously with greater than 3 L saline.  The triceps fascia was approximated nicely and AP, lateral, oblique x-rays were performed, examined, and interpreted by myself and noted to be excellent.  I was pleased with the range of motion which was full as well as other aspects of her arm.  She tolerated this well.  This was an ORIF of an olecranon, ORIF of the distal humerus intra- articular fracture, lateral ulnar collateral ligament repair, ulnar nerve release, and stress radiography/x-ray.  The patient tolerated this well.  The ulnar nerve looked excellent at the conclusion of the case.  Hemovac drain was placed followed by closure of the wound with Vicryl and skin edge with Prolene.  Long-arm splint was placed.  She will be admitted at 14 days postop, we will get her in therapy and begin some very gentle well arm assisted range of motion due to the fact that she has a high propensity towards stiffness with this type of injury.  Should any worsening of problems occur, I will be immediately available.  It was a pleasure to participate in  her care.     Dionne Ano. Amanda Pea, M.D.     [facility]  D:  12/13/2013  T:  12/13/2013  Job:  [id number]

## 2013-12-13 NOTE — Progress Notes (Signed)
Subjective: 1 Day Post-Op Procedure(s) (LRB): OPEN REDUCTION INTERNAL FIXATION (ORIF) ELBOW/OLECRANON FRACTURE (Left) Patient reports pain as 1 on 0-10 scale.   Stable. Tolerating diet. Her block is still dance.  She and her husband and counseled in regard to all intraoperative findings Objective: Vital signs in last 24 hours: Temp:  [97.7 F (36.5 C)-98.3 F (36.8 C)] 97.8 F (36.6 C) (09/02 0603) Pulse Rate:  [55-96] 70 (09/02 0603) Resp:  [10-20] 16 (09/02 0603) BP: (125-175)/(57-85) 125/59 mmHg (09/02 0603) SpO2:  [95 %-100 %] 95 % (09/02 0603) Weight:  [63.504 kg (140 lb)] 63.504 kg (140 lb) (09/01 1354)  Intake/Output from previous day: 09/01 0701 - 09/02 0700 In: 1749 [P.O.:249; I.V.:1500] Out: 700 [Urine:700] Intake/Output this shift: Total I/O In: 240 [P.O.:240] Out: 700 [Urine:700]   Recent Labs  12/12/13 1555  HGB 12.7    Recent Labs  12/12/13 1555  WBC 10.3  RBC 4.11  HCT 38.0  PLT 261    Recent Labs  12/12/13 1555  NA 135*  K 4.3  CL 96  CO2 26  BUN 18  CREATININE 0.80  GLUCOSE 110*  CALCIUM 10.1    Recent Labs  12/12/13 1555  INR 0.95    ABD soft Intact pulses distally No cellulitis present Compartment soft Hand is pink warm and without comp dating features.  Assessment/Plan: 1 Day Post-Op Procedure(s) (LRB): OPEN REDUCTION INTERNAL FIXATION (ORIF) ELBOW/OLECRANON FRACTURE (Left) Advance diet Plan for discharge tomorrow Will continue IV antibiotics and general postop observation. Likely discharge tomorrow  Karen Chafe 12/13/2013, 1:32 PM

## 2013-12-13 NOTE — Transfer of Care (Signed)
Immediate Anesthesia Transfer of Care Note  Patient: Deanna Compton  Procedure(s) Performed: Procedure(s): OPEN REDUCTION INTERNAL FIXATION (ORIF) ELBOW/OLECRANON FRACTURE (Left)  Patient Location: PACU  Anesthesia Type:GA combined with regional for post-op pain  Level of Consciousness: sedated and patient cooperative  Airway & Oxygen Therapy: Patient connected to nasal cannula oxygen  Post-op Assessment: Report given to PACU RN and Post -op Vital signs reviewed and stable  Post vital signs: Reviewed and stable  Complications: No apparent anesthesia complications

## 2013-12-13 NOTE — Op Note (Signed)
See dictation [phone] Ivory Bail Md

## 2013-12-13 NOTE — Anesthesia Postprocedure Evaluation (Signed)
Anesthesia Post Note  Patient: Deanna Compton  Procedure(s) Performed: Procedure(s) (LRB): OPEN REDUCTION INTERNAL FIXATION (ORIF) ELBOW/OLECRANON FRACTURE (Left)  Anesthesia type: general  Patient location: PACU  Post pain: Pain level controlled  Post assessment: Patient's Cardiovascular Status Stable  Last Vitals:  Filed Vitals:   12/13/13 0215  BP: 144/62  Pulse: 80  Temp: 36.6 C  Resp: 17    Post vital signs: Reviewed and stable  Level of consciousness: sedated  Complications: No apparent anesthesia complications

## 2013-12-14 ENCOUNTER — Encounter (HOSPITAL_COMMUNITY): Payer: Self-pay | Admitting: Orthopedic Surgery

## 2013-12-14 MED ORDER — METHOCARBAMOL 500 MG PO TABS: 500.0000 mg | ORAL_TABLET | Freq: Four times a day (QID) | ORAL | Status: DC | PRN

## 2013-12-14 MED ORDER — OXYCODONE HCL 5 MG PO CAPS: 5.0000 mg | ORAL_CAPSULE | ORAL | Status: DC | PRN

## 2013-12-14 MED ORDER — ONDANSETRON HCL 4 MG PO TABS: 4.0000 mg | ORAL_TABLET | Freq: Three times a day (TID) | ORAL | Status: DC | PRN

## 2013-12-14 NOTE — Progress Notes (Signed)
UR Completed.  [phone]  

## 2013-12-14 NOTE — Progress Notes (Signed)
Pt requested husband to take written scripts home to have filled at pharmacy, given to patient at [id number].

## 2013-12-14 NOTE — Progress Notes (Signed)
OT Cancellation Note  Patient Details Name: Deanna Compton MRN: [id number] DOB: 1945-07-04   Cancelled Treatment:    Reason Eval/Treat Not Completed: Patient declined, no reason specified. Attempted to see pt x3 today. First time, pt was nauseous and requested to hold therapy until later today. Second time, pt was sleeping and this OT let pt to sleep. Third time pt in bed with lights off and washcloth over forehead, reports that she is in a lot of pain. RN is aware and had just left when OT arrived. Pt plans to stay tonight and would like to work with OT in the AM. OT to follow up with pt as available.   Rae Lips [phone] 12/14/2013, 3:08 PM

## 2013-12-14 NOTE — Discharge Instructions (Signed)

## 2013-12-15 MED ORDER — TAPENTADOL HCL 50 MG PO TABS
50.0000 mg | ORAL_TABLET | ORAL | Status: DC | PRN
  Administered 2013-12-15: 50 mg via ORAL
  Filled 2013-12-15: qty 1

## 2013-12-15 NOTE — Progress Notes (Signed)
Subjective: 3 Days Post-Op Procedure(s) (LRB): OPEN REDUCTION INTERNAL FIXATION (ORIF) ELBOW/OLECRANON FRACTURE (Left) Patient is awake. Her preoperative block has now worn at all she has had noted significant pain which is proving pain medication however she has had nausea today . She has still participated in OT/PT secondary to her degree of pain and nausea. No emesis yet.  O clotlockbjective: Vital signs in last 24 hours: Temp:  [97.9 F (36.6 C)-99.4 F (37.4 C)] 99.4 F (37.4 C) (09/04 0512) Pulse Rate:  [63-70] 70 (09/04 0512) Resp:  [16-17] 16 (09/04 0512) BP: (111-120)/(44-52) 120/52 mmHg (09/04 0512) SpO2:  [82 %-96 %] 96 % (09/04 0515)  Intake/Output from previous day: 09/03 0701 - 09/04 0700 In: 1445 [P.O.:720; I.V.:675; IV Piggyback:50] Out: 1490 [Urine:1490] Intake/Output this shift:     Recent Labs  12/12/13 1555  HGB 12.7    Recent Labs  12/12/13 1555  WBC 10.3  RBC 4.11  HCT 38.0  PLT 261    Recent Labs  12/12/13 1555  NA 135*  K 4.3  CL 96  CO2 26  BUN 18  CREATININE 0.80  GLUCOSE 110*  CALCIUM 10.1    Recent Labs  12/12/13 1555  INR 0.95    Pleasant in no acute stress   atraumatic normocephalic Abdomen nontender Left upper extremity shows he has no signs that her splint clean and, digital range of motion has returned sensation has returned  Assessment/Plan: 3 Days Post-Op Procedure(s) (LRB): OPEN REDUCTION INTERNAL FIXATION (ORIF) ELBOW/OLECRANON FRACTURE (Left) Continued pain control and we will wean her from IV morphine and by mouth pain medications and work on control and nausea and encourage therapeutic endeavors . Plan for discharge tomorrow if she has had improvement of nausea.  Neli Fofana L 12/15/2013, 7:51 AM

## 2013-12-15 NOTE — Progress Notes (Signed)
Pt. Discharge to home. Discharge instructions given to patient.  No question verbalized. 

## 2013-12-15 NOTE — Progress Notes (Signed)
Occupational Therapy Treatment Patient Details Name: Deanna Compton MRN: [id number] DOB: Jun 14, 1945 Today's Date: 12/15/2013    History of present illness Deanna Compton is a 68 y.o. female s/p ORIF distal humerus and olecranon fxs; Left elbow ulnar nerve release, and Lateral ulnar collateral ligament reconstruction following a fall at home.    OT comments  Pt seen today for UB ADL and sling training. Per pt, MD has requested that pt only perform pendulums and hand exercises and educated pt on exercises with return demo. Pt dressed self with mod I and required assist from husband to don sling to prepare for d/c home. All goals met. Acute OT to sign off.    Follow Up Recommendations  No OT follow up    Equipment Recommendations  None recommended by OT    Recommendations for Other Services      Precautions / Restrictions Precautions Required Braces or Orthoses: Other Brace/Splint Other Brace/Splint: Let long arm splint and bandaging mid humerus to MCPs       Mobility Bed Mobility                  Transfers Overall transfer level: Modified independent                        ADL Overall ADL's : Needs assistance/impaired         Upper Body Bathing: Minimal assitance;Sitting Upper Body Bathing Details (indicate cue type and reason): due to L splint/bandaging     Upper Body Dressing : Standing;Set up;Supervision/safety (including sling) Upper Body Dressing Details (indicate cue type and reason): educated pt on compensatory technique for UB dressing. Lower Body Dressing: Modified independent                 General ADL Comments: Pt reports that nausea is now controlled with different pain medication and she feels significantly better than yesterday. Pt dressed self with mod I but required assist to don sling correctly. Pt performed pendulums in standing.                 Cognition  Arousal/Alertness: Awake/Alert Behavior During Therapy: WFL for tasks  assessed/performed Overall Cognitive Status: Within Functional Limits for tasks assessed                         Exercises Other Exercises Other Exercises: Per pt report, MD has requested that she only perform pendulums and finger flexion/extension. Demonstrated pendulums with pt return demo.            Pertinent Vitals/ Pain       Pain Assessment: No/denies pain         Frequency   N/A    Progress Toward Goals  OT Goals(current goals can now be found in the care plan section)  Progress towards OT goals: Goals met/education completed, patient discharged from [place] All goals met and education completed, patient discharged from OT services       End of Session Equipment Utilized During Treatment: Other (comment) (sling)   Activity Tolerance Patient tolerated treatment well   Patient Left Other (comment) (ambulating in room with husband)   Nurse Communication Other (comment) (pt dressed and ready for d/c from OT standpoint)    Functional Assessment Tool Used: clinical judgement Functional Limitation: Self care Self Care Current Status (B6389): At least 1 percent but less than 20 percent impaired, limited or restricted Self Care Goal Status (  G8988): At least 1 percent but less than 20 percent impaired, limited or restricted Self Care Discharge Status [phone]): At least 1 percent but less than 20 percent impaired, limited or restricted   Time: 1323-1349 OT Time Calculation (min): 26 min  Charges: OT G-codes **NOT FOR INPATIENT CLASS** Functional Assessment Tool Used: clinical judgement Functional Limitation: Self care Self Care Current Status (V8867): At least 1 percent but less than 20 percent impaired, limited or restricted Self Care Goal Status (R3736): At least 1 percent but less than 20 percent impaired, limited or restricted Self Care Discharge Status [phone]): At least 1 percent but less than 20 percent impaired, limited or restricted OT General  Charges $OT Visit: 1 Procedure OT Treatments $Self Care/Home Management : 23-37 mins  Juluis Rainier [phone] 12/15/2013, 2:33 PM

## 2013-12-15 NOTE — Progress Notes (Signed)
Subjective: 3 Days Post-Op Procedure(s) (LRB): OPEN REDUCTION INTERNAL FIXATION (ORIF) ELBOW/OLECRANON FRACTURE (Left) Patient is awake this morning, but fairly sleepy. Unfortunately he has vomited approximately 3 times in last night and early this morning. Her pain is better controlled. She denies fevers or chills. Prior to her emesis she was tolerating diet. She is voiding without difficulties. PR phenergan has given her significant improvement with nausea but makes her quite sedate.  Objective: Vital signs in last 24 hours: Temp:  [97.9 F (36.6 C)-99.4 F (37.4 C)] 99.4 F (37.4 C) (09/04 0512) Pulse Rate:  [63-70] 70 (09/04 0512) Resp:  [16-17] 16 (09/04 0512) BP: (111-120)/(44-52) 120/52 mmHg (09/04 0512) SpO2:  [82 %-96 %] 96 % (09/04 0515)  Intake/Output from previous day: 09/03 0701 - 09/04 0700 In: 1445 [P.O.:720; I.V.:675; IV Piggyback:50] Out: 1490 [Urine:1490] Intake/Output this shift:     Recent Labs  12/12/13 1555  HGB 12.7    Recent Labs  12/12/13 1555  WBC 10.3  RBC 4.11  HCT 38.0  PLT 261    Recent Labs  12/12/13 1555  NA 135*  K 4.3  CL 96  CO2 26  BUN 18  CREATININE 0.80  GLUCOSE 110*  CALCIUM 10.1    Recent Labs  12/12/13 1555  INR 0.95   Patient is  pleasant in no acute distress  Head atraumatic normocephalic Chest has equal expansions present Evaluation of her extremity shows that her splint is intact, she has excellent refill, radial ulnar and median is intact. Sensation is intact. There is no signs of infection or cellulitis. Assessment/Plan: 3 Days Post-Op Procedure(s) (LRB): OPEN REDUCTION INTERNAL FIXATION (ORIF) ELBOW/OLECRANON FRACTURE (Left) We'll plan toDC IV morphine and encouraged by mouth pain medications we will stop Roxicodone and and begin nucynta. She has yet participated in  therapy thus will have her see therapy and  work on better emesis/nausea control with possible discharge later this evening or tomorrow  morning  Deanna Compton L 12/15/2013, 7:57 AM

## 2014-01-07 NOTE — Discharge Summary (Signed)
Physician Discharge Summary  Patient ID: Deanna Compton MRN: [id number] DOB/AGE: March 26, 1946 68 y.o.  Admit date: 12/12/2013 Discharge date: 12/15/2013  Admission Diagnoses:Left elbow comminuted complex fracture  dislocation with distal humerus intra-articular fracture, olecranon  fracture, collateral ligament injury  Patient Active Problem List   Diagnosis Date Noted  . Fracture dislocation of left elbow joint 12/13/2013    Discharge Diagnoses:  Active Problems:   Fracture dislocation of left elbow joint   Discharged Condition: stable  Hospital Course: Patient is a very stable 68 yo female who presents with a displaced distal humerus fracture.  All issues were discussed with the patient and family at length. Given the fracture displacement the need for surgical intervention was discussed with the patient. Preoperative evaluation was performed. The patient was taken to the surgical suite and underwent the below procedure without complications ( see operative report for full details). The patient was admitted for IV antibiotics, pain control, and close observation. POD #1 the patient was noted to have difficulty with pain control and nausea. POD# 2 her pain was better controlled but nausea persisted with episodic emesis. POD#3 the patient was overall improved, she tolerated OT/PT, pain and nausea was controlled and the decision was made to discharge her home. All discharger instructions were discussed with her at length.  Consults: none  Treatments: Evaluation under anesthesia, left elbow.  2. Left elbow ulnar nerve release extensive in nature. This was an in  situ release.  3. Open reduction and internal fixation, distal humerus fracture,  intra-articular involving the capitellum and trochlea.  4. Lateral ulnar collateral ligament reconstruction, left elbow with  suture anchor fixation.  5. Open reduction and internal fixation, olecranon fracture with  Acumed plate left elbow.  6. AP,  lateral, and oblique x-rays performed, examined, and  interpreted by myself, left elbow.    Discharge Exam: Blood pressure 124/60, pulse 70, temperature 98 F (36.7 C), temperature source Oral, resp. rate 17, height  (1.6 m), weight 63.504 kg (140 lb), SpO2 98.00%. PE: Patient pleasant NAD Heent: atraumatic Chest : equal expansions, respirations are nonlabored LUE: Splint clean and intact, sensation and refill were intact, no signs of infection present, rom intact  Disposition: 01-Home or Self Care  Discharge Instructions   Call MD / Call 911    Complete by:  As directed   If you experience chest pain or shortness of breath, CALL 911 and be transported to the hospital emergency room.  If you develope a fever above 101 F, pus (white drainage) or increased drainage or redness at the wound, or calf pain, call your surgeon's office.     Call MD / Call 911    Complete by:  As directed   If you experience chest pain or shortness of breath, CALL 911 and be transported to the hospital emergency room.  If you develope a fever above 101 F, pus (white drainage) or increased drainage or redness at the wound, or calf pain, call your surgeon's office.     Constipation Prevention    Complete by:  As directed   Drink plenty of fluids.  Prune juice may be helpful.  You may use a stool softener, such as Colace (over the counter) 100 mg twice a day.  Use MiraLax (over the counter) for constipation as needed.     Constipation Prevention    Complete by:  As directed   Drink plenty of fluids.  Prune juice may be helpful.  You may use a stool softener,  such as Colace (over the counter) 100 mg twice a day.  Use MiraLax (over the counter) for constipation as needed.     Diet - low sodium heart healthy    Complete by:  As directed      Diet - low sodium heart healthy    Complete by:  As directed      Discharge instructions    Complete by:  As directed   .Marland KitchenKeep bandage clean and dry.  Call for any  problems.  No smoking.  Criteria for driving a car: you should be off your pain medicine for 7-8 hours, able to drive one handed(confident), thinking clearly and feeling able in your judgement to drive. Continue elevation as it will decrease swelling.  If instructed by MD move your fingers within the confines of the bandage/splint.  Use ice if instructed by your MD. Call immediately for any sudden loss of feeling in your hand/arm or change in functional abilities of the extremity. Marland Kitchen.We recommend that you to take vitamin C 1000 mg a day to promote healing we also recommend that if you require her pain medicine that he take a stool softener to prevent constipation as most pain medicines will have constipation side effects. We recommend either Peri-Colace or Senokot and recommend that you also consider adding MiraLAX to prevent the constipation affects from pain medicine if you are required to use them. These medicines are over the counter and maybe purchased at a local pharmacy.     Increase activity slowly as tolerated    Complete by:  As directed      Increase activity slowly as tolerated    Complete by:  As directed             Medication List    STOP taking these medications       meloxicam 7.5 MG tablet  Commonly known as:  MOBIC      TAKE these medications       aspirin 325 MG tablet  Take 325 mg by mouth daily.     atorvastatin 40 MG tablet  Commonly known as:  LIPITOR  Take 40 mg by mouth daily.     bimatoprost 0.03 % ophthalmic solution  Commonly known as:  LUMIGAN  Place 1 drop into both eyes at bedtime.     calcium carbonate 1250 MG tablet  Commonly known as:  OS-CAL - dosed in mg of elemental calcium  Take 1 tablet by mouth daily with breakfast.     cholecalciferol 1000 UNITS tablet  Commonly known as:  VITAMIN D  Take 1,000 Units by mouth daily.     dorzolamide-timolol 22.3-6.8 MG/ML ophthalmic solution  Commonly known as:  COSOPT  Place 1 drop into both eyes 2  (two) times daily.     methocarbamol 500 MG tablet  Commonly known as:  ROBAXIN  Take 1 tablet (500 mg total) by mouth every 6 (six) hours as needed for muscle spasms.     multivitamin with minerals Tabs tablet  Take 1 tablet by mouth daily.     omega-3 acid ethyl esters 1 G capsule  Commonly known as:  LOVAZA  Take 1 g by mouth 3 (three) times daily.     ondansetron 4 MG tablet  Commonly known as:  ZOFRAN  Take 1 tablet (4 mg total) by mouth every 8 (eight) hours as needed for nausea.     oxycodone 5 MG capsule  Commonly known as:  OXY-IR  Take 1 capsule (5  mg total) by mouth every 4 (four) hours as needed.     polyethylene glycol packet  Commonly known as:  MIRALAX / GLYCOLAX  Take 17 g by mouth daily.     solifenacin 5 MG tablet  Commonly known as:  VESICARE  Take 2.5 mg by mouth daily.     TURMERIC CURCUMIN PO  Take 1 capsule by mouth 3 (three) times daily.     VAGIFEM 10 MCG Tabs vaginal tablet  Generic drug:  Estradiol  Place 1 tablet vaginally every Monday, Wednesday, and Friday.           Follow-up Information   Follow up with [facility] Higinio Plan, MD. (our office will call you with appt time and date)    Specialty:  Orthopedic Surgery   Contact information:   [address] [place] [address] [phone]       Signed: Sheran Lawless 01/07/2014, 5:08 PM

## 2018-02-27 ENCOUNTER — Inpatient Hospital Stay (HOSPITAL_BASED_OUTPATIENT_CLINIC_OR_DEPARTMENT_OTHER)
Admission: EM | Admit: 2018-02-27 | Discharge: 2018-03-01 | DRG: 287 | Disposition: A | Discharge status: Home / Self Care | Payer: Medicare Other | Attending: Cardiology | Admitting: Cardiology

## 2018-02-27 ENCOUNTER — Emergency Department (HOSPITAL_BASED_OUTPATIENT_CLINIC_OR_DEPARTMENT_OTHER): Payer: Medicare Other

## 2018-02-27 ENCOUNTER — Encounter (HOSPITAL_BASED_OUTPATIENT_CLINIC_OR_DEPARTMENT_OTHER): Payer: Self-pay | Admitting: Emergency Medicine

## 2018-02-27 ENCOUNTER — Other Ambulatory Visit: Payer: Self-pay

## 2018-02-27 DIAGNOSIS — Z7982 Long term (current) use of aspirin: Secondary | ICD-10-CM | POA: Diagnosis not present

## 2018-02-27 DIAGNOSIS — I200 Unstable angina: Secondary | ICD-10-CM | POA: Diagnosis present

## 2018-02-27 DIAGNOSIS — R072 Precordial pain: Secondary | ICD-10-CM | POA: Diagnosis present

## 2018-02-27 DIAGNOSIS — Z23 Encounter for immunization: Secondary | ICD-10-CM

## 2018-02-27 DIAGNOSIS — R079 Chest pain, unspecified: Secondary | ICD-10-CM | POA: Diagnosis not present

## 2018-02-27 DIAGNOSIS — Z882 Allergy status to sulfonamides status: Secondary | ICD-10-CM | POA: Diagnosis not present

## 2018-02-27 DIAGNOSIS — E785 Hyperlipidemia, unspecified: Secondary | ICD-10-CM

## 2018-02-27 DIAGNOSIS — Z79899 Other long term (current) drug therapy: Secondary | ICD-10-CM

## 2018-02-27 DIAGNOSIS — J449 Chronic obstructive pulmonary disease, unspecified: Secondary | ICD-10-CM | POA: Diagnosis not present

## 2018-02-27 DIAGNOSIS — I10 Essential (primary) hypertension: Secondary | ICD-10-CM | POA: Diagnosis not present

## 2018-02-27 DIAGNOSIS — H409 Unspecified glaucoma: Secondary | ICD-10-CM | POA: Diagnosis not present

## 2018-02-27 DIAGNOSIS — I249 Acute ischemic heart disease, unspecified: Secondary | ICD-10-CM

## 2018-02-27 DIAGNOSIS — R0789 Other chest pain: Principal | ICD-10-CM | POA: Diagnosis present

## 2018-02-27 DIAGNOSIS — Z90710 Acquired absence of both cervix and uterus: Secondary | ICD-10-CM

## 2018-02-27 LAB — TROPONIN I
Troponin I: <0.03 ng/mL (ref <0.03)
Troponin I: <0.03 ng/mL (ref <0.03)

## 2018-02-27 LAB — BASIC METABOLIC PANEL
Anion gap: 8 (ref 5–15)
BUN: 17 mg/dL (ref 8–23)
CALCIUM: 9.5 mg/dL (ref 8.9–10.3)
CHLORIDE: 100 mmol/L (ref 98–111)
CO2: 27 mmol/L (ref 22–32)
CREATININE: 0.82 mg/dL (ref 0.44–1.00)
GFR calc non Af Amer: >60 mL/min (ref >60)
Glucose, Bld: 102 mg/dL — ABNORMAL HIGH (ref 70–99)
Potassium: 4.5 mmol/L (ref 3.5–5.1)
Sodium: 135 mmol/L (ref 135–145)

## 2018-02-27 LAB — CBC
HEMATOCRIT: 44.4 % (ref 36.0–46.0)
Hemoglobin: 14.2 g/dL (ref 12.0–15.0)
MCH: 30.9 pg (ref 26.0–34.0)
MCHC: 32 g/dL (ref 30.0–36.0)
MCV: 96.5 fL (ref 80.0–100.0)
NRBC: 0 % (ref 0.0–0.2)
PLATELETS: 218 10*3/uL (ref 150–400)
RBC: 4.6 MIL/uL (ref 3.87–5.11)
RDW: 13.5 % (ref 11.5–15.5)
WBC: 6.3 10*3/uL (ref 4.0–10.5)

## 2018-02-27 LAB — LIPASE, BLOOD: Lipase: 42 U/L (ref 11–51)

## 2018-02-27 LAB — MRSA PCR SCREENING: MRSA by PCR: NEGATIVE

## 2018-02-27 LAB — HEPATIC FUNCTION PANEL
ALK PHOS: 93 U/L (ref 38–126)
ALT: 26 U/L (ref 0–44)
AST: 30 U/L (ref 15–41)
Albumin: 3.8 g/dL (ref 3.5–5.0)
BILIRUBIN DIRECT: 0.2 mg/dL (ref 0.0–0.2)
BILIRUBIN INDIRECT: 0.7 mg/dL (ref 0.3–0.9)
TOTAL PROTEIN: 6.7 g/dL (ref 6.5–8.1)
Total Bilirubin: 0.9 mg/dL (ref 0.3–1.2)

## 2018-02-27 LAB — PROTIME-INR
INR: 0.83
PROTHROMBIN TIME: 11.3 s — AB (ref 11.4–15.2)

## 2018-02-27 LAB — HEMOGLOBIN A1C
HEMOGLOBIN A1C: 6.2 % — AB (ref 4.8–5.6)
MEAN PLASMA GLUCOSE: 131.24 mg/dL

## 2018-02-27 MED ORDER — HEPARIN BOLUS VIA INFUSION
4000.0000 [IU] | Freq: Once | INTRAVENOUS | Status: AC
  Administered 2018-02-27: 4000 [IU] via INTRAVENOUS

## 2018-02-27 MED ORDER — METOPROLOL TARTRATE 12.5 MG HALF TABLET
12.5000 mg | ORAL_TABLET | Freq: Two times a day (BID) | ORAL | Status: DC
  Administered 2018-02-28 – 2018-03-01 (×3): 12.5 mg via ORAL
  Filled 2018-02-27 (×3): qty 1

## 2018-02-27 MED ORDER — PNEUMOCOCCAL VAC POLYVALENT 25 MCG/0.5ML IJ INJ
0.5000 mL | INJECTION | INTRAMUSCULAR | Status: AC
  Administered 2018-03-01: 0.5 mL via INTRAMUSCULAR
  Filled 2018-02-27: qty 0.5

## 2018-02-27 MED ORDER — ATORVASTATIN CALCIUM 80 MG PO TABS
80.0000 mg | ORAL_TABLET | Freq: Every day | ORAL | Status: DC
  Administered 2018-02-28: 80 mg via ORAL
  Filled 2018-02-27: qty 1

## 2018-02-27 MED ORDER — DORZOLAMIDE HCL-TIMOLOL MAL 2-0.5 % OP SOLN
1.0000 [drp] | Freq: Two times a day (BID) | OPHTHALMIC | Status: DC
  Administered 2018-03-01: 1 [drp] via OPHTHALMIC
  Filled 2018-02-27: qty 10

## 2018-02-27 MED ORDER — ONDANSETRON HCL 4 MG/2ML IJ SOLN
4.0000 mg | Freq: Four times a day (QID) | INTRAMUSCULAR | Status: DC | PRN
  Administered 2018-02-28: 4 mg via INTRAVENOUS
  Filled 2018-02-27: qty 2

## 2018-02-27 MED ORDER — NITROGLYCERIN IN D5W 200-5 MCG/ML-% IV SOLN
0.0000 ug/min | Freq: Once | INTRAVENOUS | Status: AC
  Administered 2018-02-27: 5 ug/min via INTRAVENOUS
  Filled 2018-02-27: qty 250

## 2018-02-27 MED ORDER — HEPARIN (PORCINE) 25000 UT/250ML-% IV SOLN
800.0000 [IU]/h | INTRAVENOUS | Status: DC
  Administered 2018-02-27: 800 [IU]/h via INTRAVENOUS
  Filled 2018-02-27: qty 250

## 2018-02-27 MED ORDER — ASPIRIN EC 81 MG PO TBEC
81.0000 mg | DELAYED_RELEASE_TABLET | Freq: Every day | ORAL | Status: DC
  Administered 2018-02-28 – 2018-03-01 (×2): 81 mg via ORAL
  Filled 2018-02-27 (×2): qty 1

## 2018-02-27 MED ORDER — LATANOPROST 0.005 % OP SOLN
1.0000 [drp] | Freq: Every day | OPHTHALMIC | Status: DC
  Administered 2018-02-28: 1 [drp] via OPHTHALMIC
  Filled 2018-02-27: qty 2.5

## 2018-02-27 MED ORDER — ACETAMINOPHEN 500 MG PO TABS
1000.0000 mg | ORAL_TABLET | Freq: Once | ORAL | Status: AC
  Administered 2018-02-27: 1000 mg via ORAL
  Filled 2018-02-27: qty 2

## 2018-02-27 MED ORDER — ACETAMINOPHEN 325 MG PO TABS
650.0000 mg | ORAL_TABLET | ORAL | Status: DC | PRN
  Administered 2018-02-27: 650 mg via ORAL
  Filled 2018-02-27: qty 2

## 2018-02-27 MED ORDER — ONDANSETRON HCL 4 MG PO TABS: 4.0000 mg | ORAL_TABLET | Freq: Three times a day (TID) | ORAL | Status: DC | PRN

## 2018-02-27 MED ORDER — IRBESARTAN 150 MG PO TABS
300.0000 mg | ORAL_TABLET | Freq: Every day | ORAL | Status: DC
  Administered 2018-02-28 – 2018-03-01 (×2): 300 mg via ORAL
  Filled 2018-02-27 (×2): qty 2

## 2018-02-27 MED ORDER — NITROGLYCERIN 0.4 MG SL SUBL
0.4000 mg | SUBLINGUAL_TABLET | SUBLINGUAL | Status: DC | PRN
  Administered 2018-02-27 (×2): 0.4 mg via SUBLINGUAL
  Filled 2018-02-27: qty 1

## 2018-02-27 MED ORDER — ASPIRIN 81 MG PO CHEW
324.0000 mg | CHEWABLE_TABLET | Freq: Once | ORAL | Status: AC
  Administered 2018-02-27: 324 mg via ORAL
  Filled 2018-02-27: qty 4

## 2018-02-27 NOTE — Progress Notes (Signed)
ANTICOAGULATION CONSULT NOTE - Initial Consult  Pharmacy Consult for heparin Indication: chest pain/ACS  Allergies  Allergen Reactions  . Sulfa Antibiotics Other (See Comments)    Childhood allergy.    Patient Measurements: Height: 5\' 4"  (162.6 cm) Weight: 150 lb (68 kg) IBW/kg (Calculated) : 54.7 Heparin Dosing Weight: 68 kg  Vital Signs: Temp: 97.6 F (36.4 C) (11/17 1151) Temp Source: Oral (11/17 1151) BP: 137/61 (11/17 1530) Pulse Rate: 60 (11/17 1530)  Labs: Recent Labs    02/27/18 1213  HGB 14.2  HCT 44.4  PLT 218  LABPROT 11.3*  INR 0.83  CREATININE 0.82  TROPONINI <0.03    Estimated Creatinine Clearance: 58.7 mL/min (by C-G formula based on SCr of 0.82 mg/dL).  Assessment: CC/HPI: 72 yo f presenting with CP  PMH: arthritis, HTN, HLD  Anticoag: none pta - hep for acs  Renal: SCr 0.82  Heme/Onc: H&H 14.2/44.4, Plt 218  Goal of Therapy:  Heparin level 0.3-0.7 units/ml Monitor platelets by anticoagulation protocol: Yes   Plan:  Heparin bolus 4000 units x 1 Heparin gtt 800 units/hr Initial lvl 0000 Daily hep lvl cbc F/u cards plans  Isaac BlissMichael Lorrene Graef, PharmD, BCPS, BCCCP Clinical Pharmacist [phone]  Please check AMION for all [facility] numbers  02/27/2018 4:02 PM

## 2018-02-27 NOTE — Plan of Care (Signed)
Pt progressing.  Pending cath orders

## 2018-02-27 NOTE — H&P (Signed)
History and Physical   Patient ID: Deanna Compton, MRN: [id number], DOB: 11/30/1945   Date of Encounter: 02/27/2018, 8:55 PM  Primary Care Provider: System, Pcp Not In Cardiologist: NA Electrophysiologist:  NA  Chief Complaint:  CP  History of Present Illness: Deanna NearingJudy Compton is a 72 y.o. female w/ no prior cardiac hx, w/ h/o dyslipidemia and HTN presents with chest discomfort. Onset was earlier today around lunch, after pt got home from church. She describes it as a tightness or pressure in the center of her chest, radiating into her jaw and L shoulder, that gradually worsened over a period of 30-45 min or so. There was no associated SOB, nausea, diaphoresis or other associated sx. The sx did not resolve, and so her husband brought her to the ED. Discomfort did get better w/ NTG at the hospital.  Pt states she has had similar sx intermittently over the past few months, just not as severe as the episode she had today. As aforementioned, there is no prior h/o any CAD or other cardiac problems. No prior cardiac testing.  Past Medical History:  Diagnosis Date  . Arthritis   . Glaucoma   . Hyperlipemia   . Hypertension     Past Surgical History:  Procedure Laterality Date  . ABDOMINAL HYSTERECTOMY    . APPENDECTOMY    . EYE SURGERY    . ORIF ELBOW FRACTURE Left 12/12/2013   Procedure: OPEN REDUCTION INTERNAL FIXATION (ORIF) ELBOW/OLECRANON FRACTURE;  Surgeon: Dominica SeverinWilliam Gramig, MD;  Location: [facility] OR;  Service: Orthopedics;  Laterality: Left;  . TONSILLECTOMY       Prior to Admission medications   Medication Sig Start Date End Date Taking? Authorizing Provider  aspirin 325 MG tablet Take 325 mg by mouth daily.   Yes [provider]  atorvastatin (LIPITOR) 40 MG tablet Take 40 mg by mouth daily.   Yes [provider]  solifenacin (VESICARE) 5 MG tablet Take 2.5 mg by mouth daily.    Yes [provider]  telmisartan (MICARDIS) 80 MG tablet Take 80 mg by mouth daily.   Yes  [provider]  bimatoprost (LUMIGAN) 0.03 % ophthalmic solution Place 1 drop into both eyes at bedtime.     [provider]  calcium carbonate (OS-CAL - DOSED IN MG OF ELEMENTAL CALCIUM) 1250 MG tablet Take 1 tablet by mouth daily with breakfast.    [provider]  cholecalciferol (VITAMIN D) 1000 UNITS tablet Take 1,000 Units by mouth daily.    [provider]  dorzolamide-timolol (COSOPT) 22.3-6.8 MG/ML ophthalmic solution Place 1 drop into both eyes 2 (two) times daily.    [provider]  Estradiol (VAGIFEM) 10 MCG TABS vaginal tablet Place 1 tablet vaginally every Monday, Wednesday, and Friday.    [provider]  methocarbamol (ROBAXIN) 500 MG tablet Take 1 tablet (500 mg total) by mouth every 6 (six) hours as needed for muscle spasms. 12/14/13   Karie ChimeraBuchanan, Brian, PA-C  Multiple Vitamin (MULTIVITAMIN WITH MINERALS) TABS tablet Take 1 tablet by mouth daily.    [provider]  omega-3 acid ethyl esters (LOVAZA) 1 G capsule Take 1 g by mouth 3 (three) times daily.    [provider]  ondansetron (ZOFRAN) 4 MG tablet Take 1 tablet (4 mg total) by mouth every 8 (eight) hours as needed for nausea. 12/14/13   Karie ChimeraBuchanan, Brian, PA-C  oxycodone (OXY-IR) 5 MG capsule Take 1 capsule (5 mg total) by mouth every 4 (four) hours as needed. 12/14/13  Karie Chimera, PA-C  polyethylene glycol [facility] / GLYCOLAX) packet Take 17 g by mouth daily.    [provider]  TURMERIC CURCUMIN PO Take 1 capsule by mouth 3 (three) times daily.    [provider]     Allergies: Allergies  Allergen Reactions  . Sulfa Antibiotics Other (See Comments)    Childhood allergy.    Social History:  The patient  reports that she has never smoked. She has never used smokeless tobacco. She reports that she does not drink alcohol or use drugs.   Family History:  The patient's family history is not on file.   ROS:  Please see the history of  present illness.     All other systems reviewed and negative.   Vital Signs: Blood pressure (!) 145/69, pulse 68, temperature 98.4 F (36.9 C), temperature source Oral, resp. rate 19, height 5\' 4"  (1.626 m), weight 73 kg, SpO2 100 %.  PHYSICAL EXAM: General:  Well nourished, well developed, in no acute distress HEENT: normal Lymph: no adenopathy Neck: no JVD Endocrine:  No thryomegaly Vascular: No carotid bruits; DP pulses 2+ bilaterally   Cardiac:  normal S1, S2; RRR; 1/6 sys murmur at the base without R/G Lungs:  clear to auscultation bilaterally, no wheezing, rhonchi or rales  Abd: soft, nontender, no hepatomegaly  Ext: no edema Musculoskeletal:  No deformities, BUE and BLE strength normal and equal Skin: warm and dry  Neuro:  CNs 2-12 intact, no focal abnormalities noted Psych:  Normal affect   EKG:  NSR, PVC, no acute ST changes  Labs:   Lab Results  Component Value Date   WBC 6.3 02/27/2018   HGB 14.2 02/27/2018   HCT 44.4 02/27/2018   MCV 96.5 02/27/2018   PLT 218 02/27/2018   Recent Labs  Lab 02/27/18 1213  NA 135  K 4.5  CL 100  CO2 27  BUN 17  CREATININE 0.82  CALCIUM 9.5  PROT 6.7  BILITOT 0.9  ALKPHOS 93  ALT 26  AST 30  GLUCOSE 102*   Recent Labs    02/27/18 1213 02/27/18 1634  TROPONINI <0.03 <0.03   Troponin (Point of Care Test) No results for input(s): TROPIPOC in the last 72 hours.  No results found for: CHOL, HDL, LDLCALC, TRIG No results found for: DDIMER  Radiology/Studies:  Dg Chest 2 View  Result Date: 02/27/2018 CLINICAL DATA:  Chest pain and tightness. Left mandibular pain. EXAM: CHEST - 2 VIEW COMPARISON:  02/08/2017. FINDINGS: Normal sized heart. Clear lungs with normal vascularity. The lungs remain mildly hyperexpanded with mildly prominent interstitial markings and mild biapical pleural and parenchymal scarring. Diffuse osteopenia. IMPRESSION: No acute abnormality. Stable mild changes of COPD. Electronically Signed   By:  Beckie Salts M.D.   On: 02/27/2018 12:43      ASSESSMENT AND PLAN:   1. CP: possible angina. So far no acute ST changes on EKG, negative trops. Cycle enzymes overnight and will plan for stress test vs LHC and TTE in the AM. Will decrease ASA to 81mg  daily. OK to cont heparin IV gtt for now (pt transferred already on heparin).  2. HTN/dyslipidemia: will restart home regimen and adjust PRN.  Thank you for the opportunity to participate in the care of this patient. Will follow. Please call w/ questions.   Signed,  Precious Reel, MD, [facility]  02/27/2018 8:55 PM

## 2018-02-27 NOTE — ED Provider Notes (Addendum)
[facility] EMERGENCY DEPARTMENT Provider Note   CSN: [id number] Arrival date & time: 02/27/18  1142     History   Chief Complaint Chief Complaint  Patient presents with  . Chest Pain    HPI Deanna Compton is a 72 y.o. female.  HPI Patient was at church today.  She reports she felt well when she went.  At the end of church at about 1045, she started to get pressure in the center of her chest.  She reports it got really painful.  She then experienced tightness and radiation of pain into her jaw and her shoulder.  This persisted until she got home.  She tried to rest and let it past but symptoms were not resolving completely.  Her husband ultimately persuaded her to come to the emergency department.  Patient's husband reports that she has been getting episodes like this for a while.  He reports that they have not been this severe in the past and they resolve spontaneously.  He reports that she has not sought any medical care or evaluation for this condition. Past Medical History:  Diagnosis Date  . Arthritis   . Glaucoma   . Hyperlipemia   . Hypertension     Patient Active Problem List   Diagnosis Date Noted  . Chest pain due to coronary artery disease (HCC) 02/27/2018  . ACS (acute coronary syndrome) (HCC) 02/27/2018  . Fracture dislocation of left elbow joint 12/13/2013    Past Surgical History:  Procedure Laterality Date  . ABDOMINAL HYSTERECTOMY    . APPENDECTOMY    . EYE SURGERY    . ORIF ELBOW FRACTURE Left 12/12/2013   Procedure: OPEN REDUCTION INTERNAL FIXATION (ORIF) ELBOW/OLECRANON FRACTURE;  Surgeon: Dominica Severin, MD;  Location: [facility] OR;  Service: Orthopedics;  Laterality: Left;  . TONSILLECTOMY       OB History   None      Home Medications    Prior to Admission medications   Medication Sig Start Date End Date Taking? Authorizing Provider  aspirin 325 MG tablet Take 325 mg by mouth daily.   Yes [provider]  atorvastatin (LIPITOR) 40  MG tablet Take 40 mg by mouth daily.   Yes [provider]  solifenacin (VESICARE) 5 MG tablet Take 2.5 mg by mouth daily.    Yes [provider]  telmisartan (MICARDIS) 80 MG tablet Take 80 mg by mouth daily.   Yes [provider]  bimatoprost (LUMIGAN) 0.03 % ophthalmic solution Place 1 drop into both eyes at bedtime.     [provider]  calcium carbonate (OS-CAL - DOSED IN MG OF ELEMENTAL CALCIUM) 1250 MG tablet Take 1 tablet by mouth daily with breakfast.    [provider]  cholecalciferol (VITAMIN D) 1000 UNITS tablet Take 1,000 Units by mouth daily.    [provider]  dorzolamide-timolol (COSOPT) 22.3-6.8 MG/ML ophthalmic solution Place 1 drop into both eyes 2 (two) times daily.    [provider]  Estradiol (VAGIFEM) 10 MCG TABS vaginal tablet Place 1 tablet vaginally every Monday, Wednesday, and Friday.    [provider]  methocarbamol (ROBAXIN) 500 MG tablet Take 1 tablet (500 mg total) by mouth every 6 (six) hours as needed for muscle spasms. 12/14/13   Karie Chimera, PA-C  Multiple Vitamin (MULTIVITAMIN WITH MINERALS) TABS tablet Take 1 tablet by mouth daily.    [provider]  omega-3 acid ethyl esters (LOVAZA) 1 G capsule Take 1 g by mouth 3 (  three) times daily.    [provider]  ondansetron (ZOFRAN) 4 MG tablet Take 1 tablet (4 mg total) by mouth every 8 (eight) hours as needed for nausea. 12/14/13   Karie ChimeraBuchanan, Brian, PA-C  oxycodone (OXY-IR) 5 MG capsule Take 1 capsule (5 mg total) by mouth every 4 (four) hours as needed. 12/14/13   Karie ChimeraBuchanan, Brian, PA-C  polyethylene glycol [facility](MIRALAX / GLYCOLAX) packet Take 17 g by mouth daily.    [provider]  TURMERIC CURCUMIN PO Take 1 capsule by mouth 3 (three) times daily.    [provider]    Family History No family history on file.  Social History Social History   Tobacco Use  . Smoking status: Never Smoker  . Smokeless  tobacco: Never Used  Substance Use Topics  . Alcohol use: No  . Drug use: No     Allergies   Sulfa antibiotics   Review of Systems Review of Systems 10 Systems reviewed and are negative for acute change except as noted in the HPI.   Physical Exam Updated Vital Signs BP 130/61   Pulse 62   Temp 97.6 F (36.4 C) (Oral)   Resp 16   Ht 5\' 4"  (1.626 m)   Wt 68 kg   SpO2 97%   BMI 25.75 kg/m   Physical Exam  Constitutional: She is oriented to person, place, and time.  Is alert and appropriate.  No respiratory distress.  Well-nourished well-developed.  No acute distress.  HENT:  Head: Normocephalic and atraumatic.  Eyes: EOM are normal.  Neck: Neck supple.  Cardiovascular: Normal rate, regular rhythm, normal heart sounds and intact distal pulses.  Pulmonary/Chest: Effort normal and breath sounds normal.  Abdominal: Soft. She exhibits no distension. There is no tenderness. There is no guarding.  Musculoskeletal: Normal range of motion. She exhibits no edema or tenderness.  Neurological: She is alert and oriented to person, place, and time. No cranial nerve deficit. She exhibits normal muscle tone. Coordination normal.  Skin: Skin is warm and dry.  Psychiatric: She has a normal mood and affect.     ED Treatments / Results  Labs (all labs ordered are listed, but only abnormal results are displayed) Labs Reviewed  BASIC METABOLIC PANEL - Abnormal; Notable for the following components:      Result Value   Glucose, Bld 102 (*)    All other components within normal limits  PROTIME-INR - Abnormal; Notable for the following components:   Prothrombin Time 11.3 (*)    All other components within normal limits  CBC  TROPONIN I  LIPASE, BLOOD  HEPATIC FUNCTION PANEL    EKG EKG Interpretation  Date/Time:  Sunday February 27 2018 11:51:19 EST Ventricular Rate:  76 PR Interval:    QRS Duration: 101 QT Interval:  416 QTC Calculation: 426 R Axis:   13 Text  Interpretation:  Sinus rhythm Ventricular premature complex RSR' in V1 or V2, right VCD or RVH V2V3 t waves more acute, otherwise no sig change from previous Confirmed by Arby BarrettePfeiffer, Keean Wilmeth [phone]([id number]) on 02/27/2018 12:23:00 PM   Radiology Dg Chest 2 View  Result Date: 02/27/2018 CLINICAL DATA:  Chest pain and tightness. Left mandibular pain. EXAM: CHEST - 2 VIEW COMPARISON:  02/08/2017. FINDINGS: Normal sized heart. Clear lungs with normal vascularity. The lungs remain mildly hyperexpanded with mildly prominent interstitial markings and mild biapical pleural and parenchymal scarring. Diffuse osteopenia. IMPRESSION: No acute abnormality. Stable mild changes of COPD. Electronically Signed   By: Viviann SpareSteven  Azucena Kuba M.D.   On: 02/27/2018 12:43    Procedures Procedures (including critical care time) CRITICAL CARE Performed by: Arby Barrette   Total critical care time: 45 minutes  Critical care time was exclusive of separately billable procedures and treating other patients.  Critical care was necessary to treat or prevent imminent or life-threatening deterioration.  Critical care was time spent personally by me on the following activities: development of treatment plan with patient and/or surrogate as well as nursing, discussions with consultants, evaluation of patient's response to treatment, examination of patient, obtaining history from patient or surrogate, ordering and performing treatments and interventions, ordering and review of laboratory studies, ordering and review of radiographic studies, pulse oximetry and re-evaluation of patient's condition. Medications Ordered in ED Medications  nitroGLYCERIN (NITROSTAT) SL tablet 0.4 mg (0.4 mg Sublingual Given 02/27/18 1249)  nitroGLYCERIN 50 mg in dextrose 5 % 250 mL (0.2 mg/mL) infusion (has no administration in time range)  aspirin chewable tablet 324 mg (324 mg Oral Given 02/27/18 1243)  acetaminophen (TYLENOL) tablet 1,000 mg (1,000 mg Oral Given  02/27/18 1254)     Initial Impression / Assessment and Plan / ED Course  I have reviewed the triage vital signs and the nursing notes.  Pertinent labs & imaging results that were available during my care of the patient were reviewed by me and considered in my medical decision making (see chart for details).  Clinical Course as of Feb 28 1552  Wynelle Link Feb 27, 2018  1549 Recheck: Patient reports that her pain had been relieved by the nitroglycerin.  She reports however she got up and walked to the bathroom and then walking back started to get some pressure again.  She reports it is not nearly as bad as it was initially but she does have some chest pressure that has returned.  Will recontact Dr. Delton See and start heparin and nitroglycerin with change to stepdown bed.  Patient remains alert and appropriate.  She has no respiratory distress.  Skin is warm and dry.   [MP]    Clinical Course User Index [MP] Arby Barrette, MD    Consult: Reviewed with Dr. Andreas Ohm.  Admit for cardiac evaluation.  Patient presents with a fairly typical ischemic symptoms.  She reports significant chest pain with radiation to the jaw on the shoulder.  First EKG does not show STEMI pattern.  Troponin is negative.  Reportedly, patient has been getting similar symptoms but less severe off and on.  She has no prior cardiac work-up.  I have concern for possible anginal presentation.  Patient will be admitted to cardiology service for further diagnostic evaluation.  Patient upgraded to stepdown bed and nitroglycerin and heparin initiated.  Patient had been pain-free but with light activity of walking to the bathroom got chest pressure back.  She reports not as severe as previously.  She continues to be clinically stable.  We will continue to plan for transport to cardiology service and cycle enzymes.  Final Clinical Impressions(s) / ED Diagnoses   Final diagnoses:  Precordial pain  ACS (acute coronary syndrome) [facility])      ED Discharge Orders    None       Arby Barrette, MD 02/27/18 1544    Arby Barrette, MD 02/27/18 1555

## 2018-02-27 NOTE — ED Triage Notes (Signed)
Centralized chest pressure and jaw pain x 1 hour. Denies SOB, N/V

## 2018-02-28 ENCOUNTER — Encounter (HOSPITAL_COMMUNITY)
Admission: EM | Disposition: A | Discharge status: Home / Self Care | Payer: Self-pay | Source: Home / Self Care | Attending: Cardiology

## 2018-02-28 ENCOUNTER — Observation Stay (HOSPITAL_BASED_OUTPATIENT_CLINIC_OR_DEPARTMENT_OTHER): Payer: Medicare Other

## 2018-02-28 ENCOUNTER — Observation Stay (HOSPITAL_COMMUNITY): Payer: Medicare Other

## 2018-02-28 DIAGNOSIS — I370 Nonrheumatic pulmonary valve stenosis: Secondary | ICD-10-CM

## 2018-02-28 DIAGNOSIS — R072 Precordial pain: Secondary | ICD-10-CM | POA: Diagnosis not present

## 2018-02-28 DIAGNOSIS — I10 Essential (primary) hypertension: Secondary | ICD-10-CM | POA: Diagnosis not present

## 2018-02-28 DIAGNOSIS — J449 Chronic obstructive pulmonary disease, unspecified: Secondary | ICD-10-CM | POA: Diagnosis not present

## 2018-02-28 DIAGNOSIS — Z23 Encounter for immunization: Secondary | ICD-10-CM | POA: Diagnosis not present

## 2018-02-28 DIAGNOSIS — I200 Unstable angina: Secondary | ICD-10-CM

## 2018-02-28 DIAGNOSIS — R0789 Other chest pain: Secondary | ICD-10-CM | POA: Diagnosis not present

## 2018-02-28 HISTORY — PX: LEFT HEART CATH AND CORONARY ANGIOGRAPHY: CATH118249

## 2018-02-28 LAB — LIPID PANEL
CHOLESTEROL: 132 mg/dL (ref 0–200)
HDL: 40 mg/dL — ABNORMAL LOW (ref >40)
LDL Cholesterol: 73 mg/dL (ref 0–99)
Total CHOL/HDL Ratio: 3.3 RATIO
Triglycerides: 96 mg/dL (ref <150)
VLDL: 19 mg/dL (ref 0–40)

## 2018-02-28 LAB — BASIC METABOLIC PANEL
Anion gap: 7 (ref 5–15)
BUN: 16 mg/dL (ref 8–23)
CHLORIDE: 110 mmol/L (ref 98–111)
CO2: 21 mmol/L — ABNORMAL LOW (ref 22–32)
Calcium: 8.8 mg/dL — ABNORMAL LOW (ref 8.9–10.3)
Creatinine, Ser: 1.09 mg/dL — ABNORMAL HIGH (ref 0.44–1.00)
GFR calc Af Amer: 57 mL/min — ABNORMAL LOW (ref >60)
GFR calc non Af Amer: 49 mL/min — ABNORMAL LOW (ref >60)
GLUCOSE: 113 mg/dL — AB (ref 70–99)
Potassium: 4.3 mmol/L (ref 3.5–5.1)
SODIUM: 138 mmol/L (ref 135–145)

## 2018-02-28 LAB — CBC
HCT: 39.2 % (ref 36.0–46.0)
Hemoglobin: 12.6 g/dL (ref 12.0–15.0)
MCH: 30.1 pg (ref 26.0–34.0)
MCHC: 32.1 g/dL (ref 30.0–36.0)
MCV: 93.8 fL (ref 80.0–100.0)
Platelets: 206 10*3/uL (ref 150–400)
RBC: 4.18 MIL/uL (ref 3.87–5.11)
RDW: 13.7 % (ref 11.5–15.5)
WBC: 5.6 10*3/uL (ref 4.0–10.5)
nRBC: 0 % (ref 0.0–0.2)

## 2018-02-28 LAB — PROTIME-INR
INR: 1
PROTHROMBIN TIME: 13.1 s (ref 11.4–15.2)

## 2018-02-28 LAB — HEPARIN LEVEL (UNFRACTIONATED)
Heparin Unfractionated: 0.45 IU/mL (ref 0.30–0.70)
Heparin Unfractionated: 0.59 IU/mL (ref 0.30–0.70)

## 2018-02-28 LAB — ECHOCARDIOGRAM COMPLETE
Height: 64 in
WEIGHTICAEL: 2574.97 [oz_av]

## 2018-02-28 LAB — TROPONIN I: Troponin I: <0.03 ng/mL (ref <0.03)

## 2018-02-28 LAB — TSH: TSH: 2.145 u[IU]/mL (ref 0.350–4.500)

## 2018-02-28 SURGERY — LEFT HEART CATH AND CORONARY ANGIOGRAPHY
Anesthesia: LOCAL

## 2018-02-28 MED ORDER — VERAPAMIL HCL 2.5 MG/ML IV SOLN
INTRA_ARTERIAL | Status: DC | PRN
  Administered 2018-02-28: 5 mL via INTRA_ARTERIAL

## 2018-02-28 MED ORDER — LIDOCAINE HCL (PF) 1 % IJ SOLN
INTRAMUSCULAR | Status: DC | PRN
  Administered 2018-02-28: 2 mL

## 2018-02-28 MED ORDER — MIDAZOLAM HCL 2 MG/2ML IJ SOLN
INTRAMUSCULAR | Status: AC
  Filled 2018-02-28: qty 2

## 2018-02-28 MED ORDER — ONDANSETRON HCL 4 MG PO TABS: 4.0000 mg | ORAL_TABLET | Freq: Three times a day (TID) | ORAL | Status: DC | PRN

## 2018-02-28 MED ORDER — IOHEXOL 350 MG/ML SOLN
INTRAVENOUS | Status: DC | PRN
  Administered 2018-02-28: 45 mL via INTRA_ARTERIAL

## 2018-02-28 MED ORDER — SODIUM CHLORIDE 0.9% FLUSH
3.0000 mL | Freq: Two times a day (BID) | INTRAVENOUS | Status: DC
  Administered 2018-02-28 – 2018-03-01 (×2): 3 mL via INTRAVENOUS

## 2018-02-28 MED ORDER — SODIUM CHLORIDE 0.9 % IV SOLN: INTRAVENOUS | Status: AC

## 2018-02-28 MED ORDER — MORPHINE SULFATE (PF) 2 MG/ML IV SOLN
2.0000 mg | INTRAVENOUS | Status: DC | PRN
  Administered 2018-02-28: 2 mg via INTRAVENOUS
  Filled 2018-02-28: qty 1

## 2018-02-28 MED ORDER — MIDAZOLAM HCL 2 MG/2ML IJ SOLN
INTRAMUSCULAR | Status: DC | PRN
  Administered 2018-02-28: 1 mg via INTRAVENOUS

## 2018-02-28 MED ORDER — FENTANYL CITRATE (PF) 100 MCG/2ML IJ SOLN
INTRAMUSCULAR | Status: AC
  Filled 2018-02-28: qty 2

## 2018-02-28 MED ORDER — VERAPAMIL HCL 2.5 MG/ML IV SOLN
INTRAVENOUS | Status: AC
  Filled 2018-02-28: qty 2

## 2018-02-28 MED ORDER — SODIUM CHLORIDE 0.9% FLUSH: 3.0000 mL | Freq: Two times a day (BID) | INTRAVENOUS | Status: DC

## 2018-02-28 MED ORDER — ATORVASTATIN CALCIUM 40 MG PO TABS: 40.0000 mg | ORAL_TABLET | Freq: Every day | ORAL | Status: DC

## 2018-02-28 MED ORDER — SODIUM CHLORIDE 0.9% FLUSH: 3.0000 mL | INTRAVENOUS | Status: DC | PRN

## 2018-02-28 MED ORDER — ASPIRIN 81 MG PO CHEW: 81.0000 mg | CHEWABLE_TABLET | Freq: Every day | ORAL | Status: DC

## 2018-02-28 MED ORDER — FENTANYL CITRATE (PF) 100 MCG/2ML IJ SOLN
INTRAMUSCULAR | Status: DC | PRN
  Administered 2018-02-28: 25 ug via INTRAVENOUS

## 2018-02-28 MED ORDER — HEPARIN (PORCINE) IN NACL 1000-0.9 UT/500ML-% IV SOLN
INTRAVENOUS | Status: AC
  Filled 2018-02-28: qty 1000

## 2018-02-28 MED ORDER — HEPARIN SODIUM (PORCINE) 1000 UNIT/ML IJ SOLN
INTRAMUSCULAR | Status: DC | PRN
  Administered 2018-02-28: 3500 [IU] via INTRAVENOUS

## 2018-02-28 MED ORDER — NITROGLYCERIN 1 MG/10 ML FOR IR/CATH LAB
INTRA_ARTERIAL | Status: AC
  Filled 2018-02-28: qty 10

## 2018-02-28 MED ORDER — LIDOCAINE HCL (PF) 1 % IJ SOLN
INTRAMUSCULAR | Status: AC
  Filled 2018-02-28: qty 30

## 2018-02-28 MED ORDER — HEPARIN (PORCINE) IN NACL 1000-0.9 UT/500ML-% IV SOLN
INTRAVENOUS | Status: DC | PRN
  Administered 2018-02-28: 500 mL

## 2018-02-28 MED ORDER — SODIUM CHLORIDE 0.9 % IV SOLN: 250.0000 mL | INTRAVENOUS | Status: DC | PRN

## 2018-02-28 MED ORDER — ASPIRIN EC 325 MG PO TBEC: 325.0000 mg | DELAYED_RELEASE_TABLET | Freq: Every day | ORAL | Status: DC

## 2018-02-28 MED ORDER — ONDANSETRON HCL 4 MG/2ML IJ SOLN: 4.0000 mg | Freq: Four times a day (QID) | INTRAMUSCULAR | Status: DC | PRN

## 2018-02-28 MED ORDER — ACETAMINOPHEN 325 MG PO TABS: 650.0000 mg | ORAL_TABLET | ORAL | Status: DC | PRN

## 2018-02-28 MED ORDER — SODIUM CHLORIDE 0.9 % IV SOLN
INTRAVENOUS | Status: DC
  Administered 2018-02-28: 12:00:00 via INTRAVENOUS

## 2018-02-28 SURGICAL SUPPLY — 11 items
CATH INFINITI 5FR ANG PIGTAIL (CATHETERS) ×2 IMPLANT
CATH OPTITORQUE TIG 4.0 5F (CATHETERS) ×2 IMPLANT
DEVICE RAD COMP TR BAND LRG (VASCULAR PRODUCTS) ×2 IMPLANT
GLIDESHEATH SLEND A-KIT 6F 22G (SHEATH) ×2 IMPLANT
GUIDEWIRE INQWIRE 1.5J.035X260 (WIRE) ×1 IMPLANT
INQWIRE 1.5J .035X260CM (WIRE) ×2
KIT HEART LEFT (KITS) ×2 IMPLANT
PACK CARDIAC CATHETERIZATION (CUSTOM PROCEDURE TRAY) ×2 IMPLANT
TRANSDUCER W/STOPCOCK (MISCELLANEOUS) ×2 IMPLANT
TUBING CIL FLEX 10 FLL-RA (TUBING) ×2 IMPLANT
WIRE HI TORQ VERSACORE-J 145CM (WIRE) ×2 IMPLANT

## 2018-02-28 NOTE — Progress Notes (Signed)
Back from the cath lab by bed awake and alert. TR band to right  wrist intact, elevated with pillow, pulse ox to right thumb , instructed  to keep arm toward the chest and call for any sign of bleeding. Continue to monitor.

## 2018-02-28 NOTE — Progress Notes (Signed)
  Echocardiogram 2D Echocardiogram has been performed.  Tye SavoyCasey N Macarthur Lorusso 02/28/2018, 9:22 AM

## 2018-02-28 NOTE — Progress Notes (Signed)
To the cath lab by bed awake and alert. °

## 2018-02-28 NOTE — Progress Notes (Signed)
 Progress Note  Patient Name: Deanna Compton Date of Encounter: 02/28/2018  Primary Cardiologist:   Subjective   Pt had chest tightness earlier this am   None now     Inpatient Medications    Scheduled Meds: . aspirin EC  81 mg Oral Daily  . atorvastatin  80 mg Oral q1800  . dorzolamide-timolol  1 drop Both Eyes BID  . irbesartan  300 mg Oral Daily  . latanoprost  1 drop Both Eyes QHS  . metoprolol tartrate  12.5 mg Oral BID  . pneumococcal 23 valent vaccine  0.5 mL Intramuscular Tomorrow-1000   Continuous Infusions: . heparin 800 Units/hr (02/27/18 1747)   PRN Meds: acetaminophen, nitroGLYCERIN, ondansetron (ZOFRAN) IV, ondansetron   Vital Signs    Vitals:   02/27/18 1900 02/27/18 1943 02/27/18 2300 02/28/18 0300  BP: 137/68 (!) 145/69 (!) 110/55 (!) 108/49  Pulse: 74 68 65 66  Resp: (!) 21 19 15 14  Temp:  98.4 F (36.9 C) 98.2 F (36.8 C) 98.4 F (36.9 C)  TempSrc:  Oral Oral Oral  SpO2: 98% 100% 93% 98%  Weight:  73 kg    Height:  5' 4" (1.626 m)      Intake/Output Summary (Last 24 hours) at 02/28/2018 0758 Last data filed at 02/28/2018 0600 Gross per 24 hour  Intake 148.05 ml  Output -  Net 148.05 ml   Filed Weights   02/27/18 1147 02/27/18 1943  Weight: 68 kg 73 kg    Telemetry    SR/ST   - Personally Reviewed  ECG    Physical Exam   GEN: No acute distress.   Neck: No JVD Cardiac: RRR, no murmurs, rubs, or gallops.  Respiratory: Clear to auscultation bilaterally. GI: Soft, nontender, non-distended  MS: No edema; No deformity. Neuro:  Nonfocal  Psych: Normal affect   Labs    Chemistry Recent Labs  Lab 02/27/18 1213 02/28/18 0408  NA 135 138  K 4.5 4.3  CL 100 110  CO2 27 21*  GLUCOSE 102* 113*  BUN 17 16  CREATININE 0.82 1.09*  CALCIUM 9.5 8.8*  PROT 6.7  --   ALBUMIN 3.8  --   AST 30  --   ALT 26  --   ALKPHOS 93  --   BILITOT 0.9  --   GFRNONAA >60 49*  GFRAA >60 57*  ANIONGAP 8 7     Hematology Recent Labs    Lab 02/27/18 1213 02/28/18 0408  WBC 6.3 5.6  RBC 4.60 4.18  HGB 14.2 12.6  HCT 44.4 39.2  MCV 96.5 93.8  MCH 30.9 30.1  MCHC 32.0 32.1  RDW 13.5 13.7  PLT 218 206    Cardiac Enzymes Recent Labs  Lab 02/27/18 1213 02/27/18 1634 02/27/18 2242 02/28/18 0408  TROPONINI <0.03 <0.03 <0.03 <0.03   No results for input(s): TROPIPOC in the last 168 hours.   BNPNo results for input(s): BNP, PROBNP in the last 168 hours.   DDimer No results for input(s): DDIMER in the last 168 hours.   Radiology    Dg Chest 2 View  Result Date: 02/27/2018 CLINICAL DATA:  Chest pain and tightness. Left mandibular pain. EXAM: CHEST - 2 VIEW COMPARISON:  02/08/2017. FINDINGS: Normal sized heart. Clear lungs with normal vascularity. The lungs remain mildly hyperexpanded with mildly prominent interstitial markings and mild biapical pleural and parenchymal scarring. Diffuse osteopenia. IMPRESSION: No acute abnormality. Stable mild changes of COPD. Electronically Signed   By: Steven  Reid   M.D.   On: 02/27/2018 12:43    Cardiac Studies     Patient Profile     72 y.o. female with no prior cardiac history presents to ED on 11/17 with CP    Assessment & Plan    1  Chest pain   Troponins negative   Hx is concerning    She has had progressive dyspnea this past year when she walks her dog.   Last weeek she had a few episodes of chest tightness   Yesterday was significant   Now this am has had intermittent spells    ON heparin    I have reviewed preliminary echo images   LVEf is normal     REviewed eval options with pt    WIth progressive nature   No other reasons to explain I would recomm L heart cath to define anatomy, LVEDP    Risks / benefits explained   Pt understands and agrees to proceed    If completely negative consider R heart cath though RV is grossly normal on echo.    2  HTN BP is OK    3  HL  On lipitor    For questions or updates, please contact [facility] Please consult  [email or web address] for contact info under        Signed, Mayling Aber, MD  02/28/2018, 7:58 AM    

## 2018-02-28 NOTE — Progress Notes (Signed)
ANTICOAGULATION CONSULT NOTE   Pharmacy Consult for heparin Indication: chest pain/ACS  Allergies  Allergen Reactions  . Sulfa Antibiotics Other (See Comments)    Childhood allergy.    Patient Measurements: Height: 5\' 4"  (162.6 cm) Weight: 160 lb 15 oz (73 kg) IBW/kg (Calculated) : 54.7 Heparin Dosing Weight: 68 kg  Vital Signs: Temp: 98.2 F (36.8 C) (11/17 2300) Temp Source: Oral (11/17 2300) BP: 110/55 (11/17 2300) Pulse Rate: 65 (11/17 2300)  Labs: Recent Labs    02/27/18 1213 02/27/18 1634 02/27/18 2242 02/28/18 0022  HGB 14.2  --   --   --   HCT 44.4  --   --   --   PLT 218  --   --   --   LABPROT 11.3*  --   --   --   INR 0.83  --   --   --   HEPARINUNFRC  --   --   --  0.45  CREATININE 0.82  --   --   --   TROPONINI <0.03 <0.03 <0.03  --     Estimated Creatinine Clearance: 60.7 mL/min (by C-G formula based on SCr of 0.82 mg/dL).  Assessment: CC/HPI: 72 yo f presenting with CP  PMH: arthritis, HTN, HLD  Anticoag: Heparin level therpaeutic  Renal: SCr 0.82  Heme/Onc: H&H 14.2/44.4, Plt 218  Goal of Therapy:  Heparin level 0.3-0.7 units/ml Monitor platelets by anticoagulation protocol: Yes   Plan:  Heparin gtt 800 units/hr Check heparin level in 6 hours to confirm Daily hep lvl cbc F/u cards plans  Thanks for allowing pharmacy to be a part of this patient's care.  Talbert CageLora Hamlet Lasecki, PharmD Clinical Pharmacist  02/28/2018 2:07 AM

## 2018-02-28 NOTE — Progress Notes (Signed)
TR band removed, gauze 2x2 and tegaderm applied. No bleeding noted. 

## 2018-02-28 NOTE — Interval H&P Note (Signed)
Cath Lab Visit (complete for each Cath Lab visit)  Clinical Evaluation Leading to the Procedure:   ACS: Yes.    Non-ACS:    Anginal Classification: CCS II  Anti-ischemic medical therapy: No Therapy  Non-Invasive Test Results: No non-invasive testing performed  Prior CABG: No previous CABG      History and Physical Interval Note:  02/28/2018 3:37 PM  Particia NearingJudy Lanphear  has presented today for surgery, with the diagnosis of cp  The various methods of treatment have been discussed with the patient and family. After consideration of risks, benefits and other options for treatment, the patient has consented to  Procedure(s): LEFT HEART CATH AND CORONARY ANGIOGRAPHY (N/A) as a surgical intervention .  The patient's history has been reviewed, patient examined, no change in status, stable for surgery.  I have reviewed the patient's chart and labs.  Questions were answered to the patient's satisfaction.     Nanetta BattyJonathan Azeem Poorman

## 2018-02-28 NOTE — Progress Notes (Signed)
ANTICOAGULATION CONSULT NOTE   Pharmacy Consult for heparin Indication: chest pain/ACS  Allergies  Allergen Reactions  . Sulfa Antibiotics Other (See Comments)    Childhood allergy.    Patient Measurements: Height: 5\' 4"  (162.6 cm) Weight: 160 lb 15 oz (73 kg) IBW/kg (Calculated) : 54.7 Heparin Dosing Weight: 68 kg  Vital Signs: Temp: 97.8 F (36.6 C) (11/18 0740) Temp Source: Oral (11/18 0740) BP: 127/58 (11/18 0740) Pulse Rate: 64 (11/18 0740)  Labs: Recent Labs    02/27/18 1213 02/27/18 1634 02/27/18 2242 02/28/18 0022 02/28/18 0408 02/28/18 1006  HGB 14.2  --   --   --  12.6  --   HCT 44.4  --   --   --  39.2  --   PLT 218  --   --   --  206  --   LABPROT 11.3*  --   --   --  13.1  --   INR 0.83  --   --   --  1.00  --   HEPARINUNFRC  --   --   --  0.45  --  0.59  CREATININE 0.82  --   --   --  1.09*  --   TROPONINI <0.03 <0.03 <0.03  --  <0.03  --     Estimated Creatinine Clearance: 45.7 mL/min (A) (by C-G formula based on SCr of 1.09 mg/dL (H)).  Assessment: CC/HPI: 72 yo f presenting with CP - not on anticoag PTA.  Confirmatory heparin level remains therapeutic at 0.59, on 800 units/hr. Hgb 12.6, plt 206. No s/sx of bleeding. No infusion issues.   Goal of Therapy:  Heparin level 0.3-0.7 units/ml Monitor platelets by anticoagulation protocol: Yes   Plan:  Continue heparin infusion at 800 units/hr Daily hep lvl cbc F/u cards plans  Thanks for allowing pharmacy to be a part of this patient's care.  Sherron MondayKimberly Marke Goodwyn, PharmD Clinical Pharmacist  Pager: [phone] Phone: [phone] 02/28/2018 11:26 AM

## 2018-02-28 NOTE — H&P (View-Only) (Signed)
Progress Note  Patient Name: Deanna Compton Date of Encounter: 02/28/2018  Primary Cardiologist:   Subjective   Pt had chest tightness earlier this am   None now     Inpatient Medications    Scheduled Meds: . aspirin EC  81 mg Oral Daily  . atorvastatin  80 mg Oral q1800  . dorzolamide-timolol  1 drop Both Eyes BID  . irbesartan  300 mg Oral Daily  . latanoprost  1 drop Both Eyes QHS  . metoprolol tartrate  12.5 mg Oral BID  . pneumococcal 23 valent vaccine  0.5 mL Intramuscular Tomorrow-1000   Continuous Infusions: . heparin 800 Units/hr (02/27/18 1747)   PRN Meds: acetaminophen, nitroGLYCERIN, ondansetron (ZOFRAN) IV, ondansetron   Vital Signs    Vitals:   02/27/18 1900 02/27/18 1943 02/27/18 2300 02/28/18 0300  BP: 137/68 (!) 145/69 (!) 110/55 (!) 108/49  Pulse: 74 68 65 66  Resp: (!) 21 19 15 14   Temp:  98.4 F (36.9 C) 98.2 F (36.8 C) 98.4 F (36.9 C)  TempSrc:  Oral Oral Oral  SpO2: 98% 100% 93% 98%  Weight:  73 kg    Height:  5\' 4"  (1.626 m)      Intake/Output Summary (Last 24 hours) at 02/28/2018 0758 Last data filed at 02/28/2018 0600 Gross per 24 hour  Intake 148.05 ml  Output -  Net 148.05 ml   Filed Weights   02/27/18 1147 02/27/18 1943  Weight: 68 kg 73 kg    Telemetry    SR/ST   - Personally Reviewed  ECG    Physical Exam   GEN: No acute distress.   Neck: No JVD Cardiac: RRR, no murmurs, rubs, or gallops.  Respiratory: Clear to auscultation bilaterally. GI: Soft, nontender, non-distended  MS: No edema; No deformity. Neuro:  Nonfocal  Psych: Normal affect   Labs    Chemistry Recent Labs  Lab 02/27/18 1213 02/28/18 0408  NA 135 138  K 4.5 4.3  CL 100 110  CO2 27 21*  GLUCOSE 102* 113*  BUN 17 16  CREATININE 0.82 1.09*  CALCIUM 9.5 8.8*  PROT 6.7  --   ALBUMIN 3.8  --   AST 30  --   ALT 26  --   ALKPHOS 93  --   BILITOT 0.9  --   GFRNONAA >60 49*  GFRAA >60 57*  ANIONGAP 8 7     Hematology Recent Labs    Lab 02/27/18 1213 02/28/18 0408  WBC 6.3 5.6  RBC 4.60 4.18  HGB 14.2 12.6  HCT 44.4 39.2  MCV 96.5 93.8  MCH 30.9 30.1  MCHC 32.0 32.1  RDW 13.5 13.7  PLT 218 206    Cardiac Enzymes Recent Labs  Lab 02/27/18 1213 02/27/18 1634 02/27/18 2242 02/28/18 0408  TROPONINI <0.03 <0.03 <0.03 <0.03   No results for input(s): TROPIPOC in the last 168 hours.   BNPNo results for input(s): BNP, PROBNP in the last 168 hours.   DDimer No results for input(s): DDIMER in the last 168 hours.   Radiology    Dg Chest 2 View  Result Date: 02/27/2018 CLINICAL DATA:  Chest pain and tightness. Left mandibular pain. EXAM: CHEST - 2 VIEW COMPARISON:  02/08/2017. FINDINGS: Normal sized heart. Clear lungs with normal vascularity. The lungs remain mildly hyperexpanded with mildly prominent interstitial markings and mild biapical pleural and parenchymal scarring. Diffuse osteopenia. IMPRESSION: No acute abnormality. Stable mild changes of COPD. Electronically Signed   By: Beckie Salts  M.D.   On: 02/27/2018 12:43    Cardiac Studies     Patient Profile     72 y.o. female with no prior cardiac history presents to ED on 11/17 with CP    Assessment & Plan    1  Chest pain   Troponins negative   Hx is concerning    She has had progressive dyspnea this past year when she walks her dog.   Last weeek she had a few episodes of chest tightness   Yesterday was significant   Now this am has had intermittent spells    ON heparin    I have reviewed preliminary echo images   LVEf is normal     REviewed eval options with pt    WIth progressive nature   No other reasons to explain I would recomm L heart cath to define anatomy, LVEDP    Risks / benefits explained   Pt understands and agrees to proceed    If completely negative consider R heart cath though RV is grossly normal on echo.    2  HTN BP is OK    3  HL  On lipitor    For questions or updates, please contact [facility] Please consult  [email or web address] for contact info under        Signed, Dietrich PatesPaula Mariame Rybolt, MD  02/28/2018, 7:58 AM

## 2018-03-01 ENCOUNTER — Encounter (HOSPITAL_COMMUNITY): Payer: Self-pay | Admitting: Cardiovascular Disease

## 2018-03-01 ENCOUNTER — Telehealth: Payer: Self-pay | Admitting: Physician Assistant

## 2018-03-01 ENCOUNTER — Observation Stay (HOSPITAL_COMMUNITY): Payer: Medicare Other

## 2018-03-01 DIAGNOSIS — E785 Hyperlipidemia, unspecified: Secondary | ICD-10-CM

## 2018-03-01 DIAGNOSIS — R079 Chest pain, unspecified: Secondary | ICD-10-CM

## 2018-03-01 DIAGNOSIS — R0789 Other chest pain: Secondary | ICD-10-CM | POA: Diagnosis not present

## 2018-03-01 DIAGNOSIS — R072 Precordial pain: Secondary | ICD-10-CM | POA: Diagnosis present

## 2018-03-01 DIAGNOSIS — J449 Chronic obstructive pulmonary disease, unspecified: Secondary | ICD-10-CM | POA: Diagnosis not present

## 2018-03-01 DIAGNOSIS — I10 Essential (primary) hypertension: Secondary | ICD-10-CM

## 2018-03-01 DIAGNOSIS — Z23 Encounter for immunization: Secondary | ICD-10-CM | POA: Diagnosis not present

## 2018-03-01 LAB — BASIC METABOLIC PANEL
ANION GAP: 4 — AB (ref 5–15)
BUN: 15 mg/dL (ref 8–23)
CHLORIDE: 110 mmol/L (ref 98–111)
CO2: 24 mmol/L (ref 22–32)
Calcium: 8.5 mg/dL — ABNORMAL LOW (ref 8.9–10.3)
Creatinine, Ser: 0.99 mg/dL (ref 0.44–1.00)
GFR calc non Af Amer: 56 mL/min — ABNORMAL LOW (ref >60)
GLUCOSE: 108 mg/dL — AB (ref 70–99)
POTASSIUM: 4.2 mmol/L (ref 3.5–5.1)
Sodium: 138 mmol/L (ref 135–145)

## 2018-03-01 LAB — CBC
HEMATOCRIT: 37.9 % (ref 36.0–46.0)
HEMOGLOBIN: 11.9 g/dL — AB (ref 12.0–15.0)
MCH: 30 pg (ref 26.0–34.0)
MCHC: 31.4 g/dL (ref 30.0–36.0)
MCV: 95.5 fL (ref 80.0–100.0)
NRBC: 0 % (ref 0.0–0.2)
Platelets: 202 10*3/uL (ref 150–400)
RBC: 3.97 MIL/uL (ref 3.87–5.11)
RDW: 14 % (ref 11.5–15.5)
WBC: 5.4 10*3/uL (ref 4.0–10.5)

## 2018-03-01 MED ORDER — ATORVASTATIN CALCIUM 40 MG PO TABS: 20.0000 mg | ORAL_TABLET | Freq: Every day | ORAL | Status: DC

## 2018-03-01 MED ORDER — METOPROLOL TARTRATE 25 MG PO TABS: 12.5000 mg | ORAL_TABLET | Freq: Two times a day (BID) | ORAL | 3 refills | Status: DC

## 2018-03-01 NOTE — Discharge Summary (Addendum)
Discharge Summary    Patient ID: Deanna Compton,  MRN: [id number], DOB/AGE: 72/09/1945 72 y.o.  Admit date: 02/27/2018 Discharge date: 03/01/2018  Primary Care Provider: System, Pcp Not In Primary Cardiologist: Dietrich PatesPaula Jaimen Melone, MD  Discharge Diagnoses    Principal Problem:   Unstable angina [facility](HCC) Active Problems:   Essential hypertension   Hyperlipidemia   Chest pain   Allergies Allergies  Allergen Reactions  . Sulfa Antibiotics Other (See Comments)    Childhood allergy.    Diagnostic Studies/Procedures    Echocardiogram 02/28/18: Study Conclusions  - Left ventricle: The cavity size was normal. Wall thickness was   normal. Systolic function was normal. The estimated ejection   fraction was in the range of 60% to 65%. Wall motion was normal;   there were no regional wall motion abnormalities. Doppler   parameters are consistent with abnormal left ventricular   relaxation (grade 1 diastolic dysfunction). The E/e&' ratio is   between 8-15, suggesting indeterminate LV filling pressure. - Mitral valve: Mildly thickened leaflets . There was trivial   regurgitation. - Left atrium: The atrium was normal in size. - Tricuspid valve: There was trivial regurgitation. - Pulmonic valve: There was mild regurgitation. - Pulmonary arteries: PA peak pressure: 25 mm Hg (S). - Inferior vena cava: The vessel was normal in size. The   respirophasic diameter changes were in the normal range (= 50%),   consistent with normal central venous pressure.  Impressions:  - LVEF 60-65%, normal wall thickness, normal wall motion, grade 1   DD, indeterminate LV filling pressure, trivial MR, normal LA   size, mild PI, trivial TR, RVSP 25 mmHg, normal IVC.  Left heart catheterization 02/28/18: IMPRESSION: Ms. Deanna BlazingSmith has normal coronary arteries and normal LVEDP.  I do not think the etiology of her chest pain or shortness of breath or cardiovascular in origin.  The sheath was removed and a TR band  was placed on the right wrist to achieve patent hemostasis.  The patient left the lab in stable condition.  Dr. Dietrich PatesPaula Goddess Gebbia was notified of these results. _____________   History of Present Illness     72 y.o. female w/ no prior cardiac hx, w/ h/o dyslipidemia and HTN presents with chest discomfort. Onset was earlier 02/27/18 around lunch, after pt got home from church. She describes it as a tightness or pressure in the center of her chest, radiating into her jaw and L shoulder, that gradually worsened over a period of 30-45 min or so. There was no associated SOB, nausea, diaphoresis or other associated sx. The sx did not resolve, and so her husband brought her to the ED. Discomfort did get better w/ NTG at the hospital.  Pt states she has had similar sx intermittently over the past few months, just not as severe as the episode she had 02/27/18. As aforementioned, there is no prior h/o any CAD or other cardiac problems. No prior cardiac testing.  Hospital Course     Consultants: None   1. Chest pain: patient presented with chest pain c/f unstable angina. Troponins were negative. EKG without ischemic changes. She underwent a LHC 02/28/18 which revealed normal coronary arteries and normal LVEDP. Echo with EF 60-65%, normal wall motion, G1DD, and no significant valvular abnormalities. She underwent a RUQ US which did not reveal any abnormalities. It was felt that chest pain was non-cardiac in origin.  - Recommend outpatient follow-up with PCP/GI for possible GI etiology of chest pain, really upper abdominal pain now.   -  No indication for aspirin therapy   2. DOE: May be due to deconditioning. LVEDP was normal on catheterization and Echo with EF 60-65%. She maintained euvolemic status throughout admission.  - Recommended to increase activity level slowly  3. HTN: BP labile at times but overall stable. Started on metoprolol 12.5mg  BID with improvement. - Continue telmisartan 80mg  daily - Continue  metoprolol 12.5mg  BID - Can follow-up with PCP for further adjustment of BP medications  4. HLD: LDL 73 this admission. Given reassuring LHC findings she was recommended to reduce dose of lipitor to 20mg  daily.  - Continue statin at reduced dose _____________  Discharge Vitals Blood pressure 138/63, pulse 67, temperature 99.2 F (37.3 C), temperature source Oral, resp. rate 19, height 5\' 4"  (1.626 m), weight 73 kg, SpO2 97 %.  Filed Weights   02/27/18 1147 02/27/18 1943  Weight: 68 kg 73 kg    Labs & Radiologic Studies    CBC Recent Labs    02/28/18 0408 03/01/18 0255  WBC 5.6 5.4  HGB 12.6 11.9*  HCT 39.2 37.9  MCV 93.8 95.5  PLT 206 202   Basic Metabolic Panel Recent Labs    16/10/96 0408 03/01/18 0255  NA 138 138  K 4.3 4.2  CL 110 110  CO2 21* 24  GLUCOSE 113* 108*  BUN 16 15  CREATININE 1.09* 0.99  CALCIUM 8.8* 8.5*   Liver Function Tests Recent Labs    02/27/18 1213  AST 30  ALT 26  ALKPHOS 93  BILITOT 0.9  PROT 6.7  ALBUMIN 3.8   Recent Labs    02/27/18 1213  LIPASE 42   Cardiac Enzymes Recent Labs    02/27/18 2242 02/28/18 0408 02/28/18 1006  TROPONINI <0.03 <0.03 <0.03   BNP Invalid input(s): POCBNP D-Dimer No results for input(s): DDIMER in the last 72 hours. Hemoglobin A1C Recent Labs    02/27/18 2242  HGBA1C 6.2*   Fasting Lipid Panel Recent Labs    02/28/18 0408  CHOL 132  HDL 40*  LDLCALC 73  TRIG 96  CHOLHDL 3.3   Thyroid Function Tests Recent Labs    02/27/18 2242  TSH 2.145   _____________  Dg Chest 2 View  Result Date: 02/27/2018 CLINICAL DATA:  Chest pain and tightness. Left mandibular pain. EXAM: CHEST - 2 VIEW COMPARISON:  02/08/2017. FINDINGS: Normal sized heart. Clear lungs with normal vascularity. The lungs remain mildly hyperexpanded with mildly prominent interstitial markings and mild biapical pleural and parenchymal scarring. Diffuse osteopenia. IMPRESSION: No acute abnormality. Stable mild  changes of COPD. Electronically Signed   By: Beckie Salts M.D.   On: 02/27/2018 12:43   US Abdomen Limited Ruq  Result Date: 03/01/2018 CLINICAL DATA:  Chest pain and right upper quadrant pain. EXAM: ULTRASOUND ABDOMEN LIMITED RIGHT UPPER QUADRANT COMPARISON:  None. FINDINGS: Gallbladder: No gallstones or wall thickening visualized. No sonographic Murphy sign noted by sonographer. Common bile duct: Diameter: 4.6 mm, normal Liver: No focal lesion identified. Within normal limits in parenchymal echogenicity. Portal vein is patent on color Doppler imaging with normal direction of blood flow towards the liver. IMPRESSION: No evidence of cholelithiasis or cholecystitis. Electronically Signed   By: Burman Nieves M.D.   On: 03/01/2018 04:53   Disposition   Patient was seen and examined by Dr. Tenny Craw who deemed patient as stable for discharge. Follow-up has been arranged. Discharge medications as listed below.   Follow-up Plans & Appointments    Follow-up Information    Beatrice Lecher,  PA-C Follow up on 03/15/2018.   Specialties:  Cardiology, Physician Assistant Why:  Please arrive 15 minutes early for your 8:45am post-hospital cardiology follow-up appointment Contact information: [address] [place] [address] [phone]          Discharge Instructions    Diet - low sodium heart healthy   Complete by:  As directed    Increase activity slowly   Complete by:  As directed       Discharge Medications   Allergies as of 03/01/2018      Reactions   Sulfa Antibiotics Other (See Comments)   Childhood allergy.      Medication List    STOP taking these medications   aspirin 325 MG EC tablet   ondansetron 4 MG tablet Commonly known as:  ZOFRAN   oxycodone 5 MG capsule Commonly known as:  OXY-IR     TAKE these medications   atorvastatin 40 MG tablet Commonly known as:  LIPITOR Take 0.5 tablets (20 mg total) by mouth daily at 6 PM. What changed:     how much to take  when to take this   bimatoprost 0.03 % ophthalmic solution Commonly known as:  LUMIGAN Place 1 drop into both eyes at bedtime.   C COMPLEX Tbcr Take 1,000 mg by mouth daily.   calcium carbonate 1250 (500 Ca) MG tablet Commonly known as:  OS-CAL - dosed in mg of elemental calcium Take 1 tablet by mouth daily with breakfast.   cholecalciferol 1000 units tablet Commonly known as:  VITAMIN D Take 1,000 Units by mouth daily.   dorzolamide-timolol 22.3-6.8 MG/ML ophthalmic solution Commonly known as:  COSOPT Place 1 drop into both eyes 2 (two) times daily.   Melatonin 10 MG Tabs Take 10 mg by mouth at bedtime.   methocarbamol 500 MG tablet Commonly known as:  ROBAXIN Take 1 tablet (500 mg total) by mouth every 6 (six) hours as needed for muscle spasms.   metoprolol tartrate 25 MG tablet Commonly known as:  LOPRESSOR Take 0.5 tablets (12.5 mg total) by mouth 2 (two) times daily.   multivitamin with minerals Tabs tablet Take 1 tablet by mouth daily.   omega-3 acid ethyl esters 1 g capsule Commonly known as:  LOVAZA Take 1 g by mouth 3 (three) times daily.   polyethylene glycol packet Commonly known as:  MIRALAX / GLYCOLAX Take 17 g by mouth daily as needed for mild constipation.   solifenacin 5 MG tablet Commonly known as:  VESICARE Take 2.5 mg by mouth daily.   telmisartan 80 MG tablet Commonly known as:  MICARDIS Take 80 mg by mouth daily.   TURMERIC CURCUMIN PO Take 1 capsule by mouth daily.       Outstanding Labs/Studies   None  Duration of Discharge Encounter   Greater than 30 minutes including physician time.  Signed, Beatriz Stallion PA-C 03/01/2018, 2:08 PM

## 2018-03-01 NOTE — Progress Notes (Signed)
Progress Note  Patient Name: Deanna Compton Date of Encounter: 03/01/2018  Primary Cardiologist: New  Subjective   Pt with vague upper abdominal pain   Now more LUQ    No CP  Breathing is OK  Inpatient Medications    Scheduled Meds: . aspirin EC  81 mg Oral Daily  . atorvastatin  80 mg Oral q1800  . dorzolamide-timolol  1 drop Both Eyes BID  . irbesartan  300 mg Oral Daily  . latanoprost  1 drop Both Eyes QHS  . metoprolol tartrate  12.5 mg Oral BID  . pneumococcal 23 valent vaccine  0.5 mL Intramuscular Tomorrow-1000  . sodium chloride flush  3 mL Intravenous Q12H   Continuous Infusions: . sodium chloride     PRN Meds: sodium chloride, acetaminophen, morphine injection, nitroGLYCERIN, ondansetron (ZOFRAN) IV, ondansetron, sodium chloride flush   Vital Signs    Vitals:   02/28/18 2125 02/28/18 2140 02/28/18 2300 03/01/18 0300  BP: (!) 131/52  (!) 102/53 (!) 115/59  Pulse: 70 74 94 (!) 58  Resp:  20 15 12   Temp:   98.7 F (37.1 C) (!) 97 F (36.1 C)  TempSrc:   Oral Oral  SpO2:  94% 93% 94%  Weight:      Height:        Intake/Output Summary (Last 24 hours) at 03/01/2018 0715 Last data filed at 02/28/2018 2125 Gross per 24 hour  Intake 1174 ml  Output 750 ml  Net 424 ml   Filed Weights   02/27/18 1147 02/27/18 1943  Weight: 68 kg 73 kg    Telemetry     SR    - Personally Reviewed  ECG    Physical Exam   GEN: No acute distress.   Neck: No JVD Cardiac: RRR, no murmurs, rubs, or gallops.  Respiratory: Clear to auscultation bilaterally. GI: Soft, nontender, non-distended Normal BS    No rebound  No masses MS: No edema; No deformity. Neuro:  Nonfocal  Psych: Normal affect   Labs    Chemistry Recent Labs  Lab 02/27/18 1213 02/28/18 0408 03/01/18 0255  NA 135 138 138  K 4.5 4.3 4.2  CL 100 110 110  CO2 27 21* 24  GLUCOSE 102* 113* 108*  BUN 17 16 15   CREATININE 0.82 1.09* 0.99  CALCIUM 9.5 8.8* 8.5*  PROT 6.7  --   --   ALBUMIN 3.8  --    --   AST 30  --   --   ALT 26  --   --   ALKPHOS 93  --   --   BILITOT 0.9  --   --   GFRNONAA >60 49* 56*  GFRAA >60 57* >60  ANIONGAP 8 7 4*     Hematology Recent Labs  Lab 02/27/18 1213 02/28/18 0408 03/01/18 0255  WBC 6.3 5.6 5.4  RBC 4.60 4.18 3.97  HGB 14.2 12.6 11.9*  HCT 44.4 39.2 37.9  MCV 96.5 93.8 95.5  MCH 30.9 30.1 30.0  MCHC 32.0 32.1 31.4  RDW 13.5 13.7 14.0  PLT 218 206 202    Cardiac Enzymes Recent Labs  Lab 02/27/18 1634 02/27/18 2242 02/28/18 0408 02/28/18 1006  TROPONINI <0.03 <0.03 <0.03 <0.03   No results for input(s): TROPIPOC in the last 168 hours.   BNPNo results for input(s): BNP, PROBNP in the last 168 hours.   DDimer No results for input(s): DDIMER in the last 168 hours.   Radiology    Dg Chest  2 View  Result Date: 02/27/2018 CLINICAL DATA:  Chest pain and tightness. Left mandibular pain. EXAM: CHEST - 2 VIEW COMPARISON:  02/08/2017. FINDINGS: Normal sized heart. Clear lungs with normal vascularity. The lungs remain mildly hyperexpanded with mildly prominent interstitial markings and mild biapical pleural and parenchymal scarring. Diffuse osteopenia. IMPRESSION: No acute abnormality. Stable mild changes of COPD. Electronically Signed   By: Beckie Salts M.D.   On: 02/27/2018 12:43   US Abdomen Limited Ruq  Result Date: 03/01/2018 CLINICAL DATA:  Chest pain and right upper quadrant pain. EXAM: ULTRASOUND ABDOMEN LIMITED RIGHT UPPER QUADRANT COMPARISON:  None. FINDINGS: Gallbladder: No gallstones or wall thickening visualized. No sonographic Murphy sign noted by sonographer. Common bile duct: Diameter: 4.6 mm, normal Liver: No focal lesion identified. Within normal limits in parenchymal echogenicity. Portal vein is patent on color Doppler imaging with normal direction of blood flow towards the liver. IMPRESSION: No evidence of cholelithiasis or cholecystitis. Electronically Signed   By: Burman Nieves M.D.   On: 03/01/2018 04:53     Cardiac Studies   Echo------------------------------------------------------------------- Study Conclusions  - Left ventricle: The cavity size was normal. Wall thickness was   normal. Systolic function was normal. The estimated ejection   fraction was in the range of 60% to 65%. Wall motion was normal;   there were no regional wall motion abnormalities. Doppler   parameters are consistent with abnormal left ventricular   relaxation (grade 1 diastolic dysfunction). The E/e&' ratio is   between 8-15, suggesting indeterminate LV filling pressure. - Mitral valve: Mildly thickened leaflets . There was trivial   regurgitation. - Left atrium: The atrium was normal in size. - Tricuspid valve: There was trivial regurgitation. - Pulmonic valve: There was mild regurgitation. - Pulmonary arteries: PA peak pressure: 25 mm Hg (S). - Inferior vena cava: The vessel was normal in size. The   respirophasic diameter changes were in the normal range (= 50%),   consistent with normal central venous pressure.  Impressions:  - LVEF 60-65%, normal wall thickness, normal wall motion, grade 1   DD, indeterminate LV filling pressure, trivial MR, normal LA   size, mild PI, trivial TR, RVSP 25 mmHg, normal IVC. :   11/18:  CATH  11/18  NOrmal coronary arteries    LVEDP 8 mm HG  Patient Profile     72 y.o. female with no prior cardiac history presents to ED on 11/17 with CP    Assessment & Plan    1  Chest pain   Troponins negative  L heart cath yesterday   Coronary arteries normal LVEDP normal   Plan for RUQ ultrasound today  This was done   Normal  Pain is not cardiac in origin   Last night more RUQ   Now LUQ   ? GI   (? Functional)   I would defer further testing for outpt   Reassured patient   I have asked her to increase actvity slowly   DOE may represent deconditioning, decreased lusitropy   2  HTN BP is labile.   Mildly elevated at times   I would defer to outpt management    3   HL  On lipitor  LDL 73     Had been on 40 mg at home  With findings at cath could pull back to 20 mg per day  FOllow up as outpt.     With cath findings she can  come off of ASA   Was on 325  mg per day at admit   Pt does not have an internist now   HErs through [facility] retired.    About to see one   WIll  For questions or updates, please contact [facility] Please consult [email or web address] for contact info under        Signed, Dietrich Pates, MD  03/01/2018, 7:15 AM

## 2018-03-01 NOTE — Progress Notes (Signed)
Pt. Arrived back from ultrasound. Resting comfortably in bed with call bell in reach. Will continue to monitor.

## 2018-03-01 NOTE — Progress Notes (Signed)
Pt ambulated x3 ~ 8100ft in the hallway. Denies any CP, SOB. Sats 96%, VS stable.

## 2018-03-01 NOTE — Progress Notes (Signed)
Transported to ultrasound by wheelchair. Alert and oriented X 4.

## 2018-03-01 NOTE — Care Plan (Signed)
Pt progressing

## 2018-03-01 NOTE — Telephone Encounter (Signed)
Per the pts chart she has not been discharged from the hospital at this time. We will call once the pt has been discharged.

## 2018-03-01 NOTE — Discharge Instructions (Signed)
PLEASE REMEMBER TO BRING ALL OF YOUR MEDICATIONS TO EACH OF YOUR FOLLOW-UP OFFICE VISITS.  PLEASE ATTEND ALL SCHEDULED FOLLOW-UP APPOINTMENTS.   Activity: Increase activity slowly as tolerated. You may shower, but no soaking baths (or swimming) for 1 week. No driving for 24 hours. No lifting over 5 lbs for 1 week. No sexual activity for 1 week.   You May Return to Work: in 1 week (if applicable)  Wound Care: You may wash cath site gently with soap and water. Keep cath site clean and dry. If you notice pain, swelling, bleeding or pus at your cath site, please call [phone].  ______________________________________________________________  Please call to schedule an appointment with your PCP to be seen within 1-2 weeks of discharge. They will be able to help with any further work-up for chest pain as your work up here was reassuring that the chest pain is not from your heart.  ______________________________________________________________  Medication changes: - You do not need to take aspirin anymore given that there was no plaque buildup in the arteries around your heart - You can reduce your atorvastatin dose to 20mg  daily as your cholesterol was well controlled and you have no plaque buildup in the arteries around your heart - Start metoprolol 12.5mg  two times per day to improve your blood pressure control

## 2018-03-01 NOTE — Telephone Encounter (Signed)
New Message   Patient has a TOC appt scheduled with Tereso NewcomerScott Weaver 12/3.

## 2018-03-01 NOTE — Care Management Important Message (Signed)
Important Message  Patient Details  Name: Deanna Compton MRN: [id number] Date of Birth: 10/21/1945   Medicare Important Message Given:  Yes    Cherylann ParrClaxton, Aurea Aronov S, RN 03/01/2018, 9:17 AM

## 2018-03-02 NOTE — Telephone Encounter (Signed)
Patient [facility]  on 03/01/[facility].  Patient understands to follow up with provider Tereso NewcomerScott Weaver PA-C on 03/15/18 at [facility]. Patient understands discharge instructions? yes Patient understands medications and regiment? yes Patient understands to bring all medications to this visit? yes  Pt had no further questions or concerns at this time.  Pt gracious for the prompt follow-up.

## 2018-03-15 ENCOUNTER — Encounter: Payer: Self-pay | Admitting: Physician Assistant

## 2018-03-15 ENCOUNTER — Ambulatory Visit: Payer: Medicare Other | Admitting: Medical

## 2018-03-15 VITALS — BP 150/64 | HR 56 | Ht 64.0 in | Wt 165.8 lb

## 2018-03-15 DIAGNOSIS — E785 Hyperlipidemia, unspecified: Secondary | ICD-10-CM

## 2018-03-15 DIAGNOSIS — R079 Chest pain, unspecified: Secondary | ICD-10-CM | POA: Diagnosis not present

## 2018-03-15 DIAGNOSIS — I10 Essential (primary) hypertension: Secondary | ICD-10-CM | POA: Diagnosis not present

## 2018-03-15 NOTE — Patient Instructions (Signed)
Medication Instructions:  Your physician recommends that you continue on your current medications as directed. Please refer to the Current Medication list given to you today.  If you need a refill on your cardiac medications before your next appointment, please call your pharmacy.   Lab work: NONE If you have labs (blood work) drawn today and your tests are completely normal, you will receive your results only by: Marland Kitchen MyChart Message (if you have MyChart) OR . A paper copy in the mail If you have any lab test that is abnormal or we need to change your treatment, we will call you to review the results.  Testing/Procedures: NONE  Follow-Up: At [facility], you and your health needs are our priority.  As part of our continuing mission to provide you with exceptional heart care, we have created designated Provider Care Teams.  These Care Teams include your primary Cardiologist (physician) and Advanced Practice Providers (APPs -  Physician Assistants and Nurse Practitioners) who all work together to provide you with the care you need, when you need it. . FOLLOW UP AS NEEDED.  Any Other Special Instructions Will Be Listed Below (If Applicable).  DASH Eating Plan DASH stands for "Dietary Approaches to Stop Hypertension." The DASH eating plan is a healthy eating plan that has been shown to reduce high blood pressure (hypertension). It may also reduce your risk for type 2 diabetes, heart disease, and stroke. The DASH eating plan may also help with weight loss. What are tips for following this plan? General guidelines  Avoid eating more than 2,300 mg (milligrams) of salt (sodium) a day. If you have hypertension, you may need to reduce your sodium intake to 1,500 mg a day.  Limit alcohol intake to no more than 1 drink a day for nonpregnant women and 2 drinks a day for men. One drink equals 12 oz of beer, 5 oz of wine, or 1 oz of hard liquor.  Work with your health care provider to maintain a  healthy body weight or to lose weight. Ask what an ideal weight is for you.  Get at least 30 minutes of exercise that causes your heart to beat faster (aerobic exercise) most days of the week. Activities may include walking, swimming, or biking.  Work with your health care provider or diet and nutrition specialist (dietitian) to adjust your eating plan to your individual calorie needs. Reading food labels  Check food labels for the amount of sodium per serving. Choose foods with less than 5 percent of the Daily Value of sodium. Generally, foods with less than 300 mg of sodium per serving fit into this eating plan.  To find whole grains, look for the word "whole" as the first word in the ingredient list. Shopping  Buy products labeled as "low-sodium" or "no salt added."  Buy fresh foods. Avoid canned foods and premade or frozen meals. Cooking  Avoid adding salt when cooking. Use salt-free seasonings or herbs instead of table salt or sea salt. Check with your health care provider or pharmacist before using salt substitutes.  Do not fry foods. Cook foods using healthy methods such as baking, boiling, grilling, and broiling instead.  Cook with heart-healthy oils, such as olive, canola, soybean, or sunflower oil. Meal planning   Eat a balanced diet that includes: ? 5 or more servings of fruits and vegetables each day. At each meal, try to fill half of your plate with fruits and vegetables. ? Up to 6-8 servings of whole grains each  day. ? Less than 6 oz of lean meat, poultry, or fish each day. A 3-oz serving of meat is about the same size as a deck of cards. One egg equals 1 oz. ? 2 servings of low-fat dairy each day. ? A serving of nuts, seeds, or beans 5 times each week. ? Heart-healthy fats. Healthy fats called Omega-3 fatty acids are found in foods such as flaxseeds and coldwater fish, like sardines, salmon, and mackerel.  Limit how much you eat of the following: ? Canned or  prepackaged foods. ? Food that is high in trans fat, such as fried foods. ? Food that is high in saturated fat, such as fatty meat. ? Sweets, desserts, sugary drinks, and other foods with added sugar. ? Full-fat dairy products.  Do not salt foods before eating.  Try to eat at least 2 vegetarian meals each week.  Eat more home-cooked food and less restaurant, buffet, and fast food.  When eating at a restaurant, ask that your food be prepared with less salt or no salt, if possible. What foods are recommended? The items listed may not be a complete list. Talk with your dietitian about what dietary choices are best for you. Grains Whole-grain or whole-wheat bread. Whole-grain or whole-wheat pasta. Brown rice. Orpah Cobb. Bulgur. Whole-grain and low-sodium cereals. Pita bread. Low-fat, low-sodium crackers. Whole-wheat flour tortillas. Vegetables Fresh or frozen vegetables (raw, steamed, roasted, or grilled). Low-sodium or reduced-sodium tomato and vegetable juice. Low-sodium or reduced-sodium tomato sauce and tomato paste. Low-sodium or reduced-sodium canned vegetables. Fruits All fresh, dried, or frozen fruit. Canned fruit in natural juice (without added sugar). Meat and other protein foods Skinless chicken or [place]. Ground chicken or [place]. Pork with fat trimmed off. Fish and seafood. Egg whites. Dried beans, peas, or lentils. Unsalted nuts, nut butters, and seeds. Unsalted canned beans. Lean cuts of beef with fat trimmed off. Low-sodium, lean deli meat. Dairy Low-fat (1%) or fat-free (skim) milk. Fat-free, low-fat, or reduced-fat cheeses. Nonfat, low-sodium ricotta or cottage cheese. Low-fat or nonfat yogurt. Low-fat, low-sodium cheese. Fats and oils Soft margarine without trans fats. Vegetable oil. Low-fat, reduced-fat, or light mayonnaise and salad dressings (reduced-sodium). Canola, safflower, olive, soybean, and sunflower oils. Avocado. Seasoning and other foods Herbs. Spices.  Seasoning mixes without salt. Unsalted popcorn and pretzels. Fat-free sweets. What foods are not recommended? The items listed may not be a complete list. Talk with your dietitian about what dietary choices are best for you. Grains Baked goods made with fat, such as croissants, muffins, or some breads. Dry pasta or rice meal packs. Vegetables Creamed or fried vegetables. Vegetables in a cheese sauce. Regular canned vegetables (not low-sodium or reduced-sodium). Regular canned tomato sauce and paste (not low-sodium or reduced-sodium). Regular tomato and vegetable juice (not low-sodium or reduced-sodium). Rosita Fire. Olives. Fruits Canned fruit in a light or heavy syrup. Fried fruit. Fruit in cream or butter sauce. Meat and other protein foods Fatty cuts of meat. Ribs. Fried meat. Tomasa Blase. Sausage. Bologna and other processed lunch meats. Salami. Fatback. Hotdogs. Bratwurst. Salted nuts and seeds. Canned beans with added salt. Canned or smoked fish. Whole eggs or egg yolks. Chicken or [place] with skin. Dairy Whole or 2% milk, cream, and half-and-half. Whole or full-fat cream cheese. Whole-fat or sweetened yogurt. Full-fat cheese. Nondairy creamers. Whipped toppings. Processed cheese and cheese spreads. Fats and oils Butter. Stick margarine. Lard. Shortening. Ghee. Bacon fat. Tropical oils, such as coconut, palm kernel, or palm oil. Seasoning and other foods Salted popcorn and pretzels.  Onion salt, garlic salt, seasoned salt, table salt, and sea salt. Worcestershire sauce. Tartar sauce. Barbecue sauce. Teriyaki sauce. Soy sauce, including reduced-sodium. Steak sauce. Canned and packaged gravies. Fish sauce. Oyster sauce. Cocktail sauce. Horseradish that you find on the shelf. Ketchup. Mustard. Meat flavorings and tenderizers. Bouillon cubes. Hot sauce and Tabasco sauce. Premade or packaged marinades. Premade or packaged taco seasonings. Relishes. Regular salad dressings. Where to find more  information:  National Heart, Lung, and Blood Institute: PopSteam.iswww.nhlbi.nih.gov  American Heart Association: www.heart.org Summary  The DASH eating plan is a healthy eating plan that has been shown to reduce high blood pressure (hypertension). It may also reduce your risk for type 2 diabetes, heart disease, and stroke.  With the DASH eating plan, you should limit salt (sodium) intake to 2,300 mg a day. If you have hypertension, you may need to reduce your sodium intake to 1,500 mg a day.  When on the DASH eating plan, aim to eat more fresh fruits and vegetables, whole grains, lean proteins, low-fat dairy, and heart-healthy fats.  Work with your health care provider or diet and nutrition specialist (dietitian) to adjust your eating plan to your individual calorie needs. This information is not intended to replace advice given to you by your health care provider. Make sure you discuss any questions you have with your health care provider. Document Released: 03/19/2011 Document Revised: 03/23/2016 Document Reviewed: 03/23/2016 Elsevier Interactive Patient Education  [employer]2018 Elsevier Inc.

## 2018-03-15 NOTE — Progress Notes (Signed)
Cardiology Office Note   Date:  03/15/2018   ID:  Deanna Compton, DOB 02/10/1946, MRN [id number]  PCP:  System, Pcp Not In  Cardiologist:  Dietrich PatesPaula Ross, MD EP: None  Chief Complaint  Patient presents with  . Hospitalization Follow-up    chest pain      History of Present Illness: Deanna Compton is a 72 y.o. female with PMH of HTN, HLD, and recent admission for chest pain who presents for post hospital follow-up of chest pain. She was admitted to the hospital from 02/27/18-03/01/18 after presenting with chest pain felt to be 2/2 unstable angina. Her troponins were negative. Echo revealed EF 60-65% and G1DD. She underwent a LHC which revealed normal coronaries, suggestive of non-cardiac cause of chest pain. She was recommended to follow-up with her PCP/GI for possible GI etiology of chest pain.   Since discharge from the hospital she reports doing well. She was seen by her PCP office and started on pepcid 20mg  BID for reflux with significant improvement in her chest pain and frequent belching. She reports having some constipation for which she has been taking miralax. She does not check her blood pressure at home. She has been a little less active now that the weather is turning and her dog recently had knee surgery and is unable to go for walks. She is hopeful to increase her activity soon. We discussed the importance of maintaining a low sodium diet. She denies SOB, DOE, palpitations, or LE edema.      Past Medical History:  Diagnosis Date  . Arthritis   . Glaucoma   . Hyperlipemia   . Hypertension     Past Surgical History:  Procedure Laterality Date  . ABDOMINAL HYSTERECTOMY    . APPENDECTOMY    . EYE SURGERY    . LEFT HEART CATH AND CORONARY ANGIOGRAPHY N/A 02/28/2018   Procedure: LEFT HEART CATH AND CORONARY ANGIOGRAPHY;  Surgeon: Runell GessBerry, Jonathan J, MD;  Location: [facility];  Service: Cardiovascular;  Laterality: N/A;  . ORIF ELBOW FRACTURE Left 12/12/2013   Procedure:  OPEN REDUCTION INTERNAL FIXATION (ORIF) ELBOW/OLECRANON FRACTURE;  Surgeon: Dominica SeverinWilliam Gramig, MD;  Location: [facility] OR;  Service: Orthopedics;  Laterality: Left;  . TONSILLECTOMY       Current Outpatient Medications  Medication Sig Dispense Refill  . atorvastatin (LIPITOR) 40 MG tablet Take 0.5 tablets (20 mg total) by mouth daily at 6 PM.    . bimatoprost (LUMIGAN) 0.03 % ophthalmic solution Place 1 drop into both eyes at bedtime.     Marland Kitchen. Bioflavonoid Products (C COMPLEX) TBCR Take 1,000 mg by mouth daily.    . calcium carbonate (OS-CAL - DOSED IN MG OF ELEMENTAL CALCIUM) 1250 MG tablet Take 1 tablet by mouth daily with breakfast.    . cholecalciferol (VITAMIN D) 1000 UNITS tablet Take 1,000 Units by mouth daily.    . famotidine (PEPCID) 20 MG tablet Take 20 mg by mouth 2 (two) times daily.    . Melatonin 10 MG TABS Take 10 mg by mouth at bedtime.    . metoprolol tartrate (LOPRESSOR) 25 MG tablet Take 0.5 tablets (12.5 mg total) by mouth 2 (two) times daily. 30 tablet 3  . Multiple Vitamin (MULTIVITAMIN WITH MINERALS) TABS tablet Take 1 tablet by mouth daily.    Marland Kitchen. omega-3 acid ethyl esters (LOVAZA) 1 G capsule Take 1 g by mouth 3 (three) times daily.    . polyethylene glycol (MIRALAX / GLYCOLAX) packet Take 17 g by mouth  daily as needed for mild constipation.     . solifenacin (VESICARE) 5 MG tablet Take 2.5 mg by mouth daily.     Marland Kitchen telmisartan (MICARDIS) 80 MG tablet Take 80 mg by mouth daily.    . TURMERIC CURCUMIN PO Take 1 capsule by mouth daily.      No current facility-administered medications for this visit.     Allergies:   Sulfa antibiotics    Social History:  The patient  reports that she has never smoked. She has never used smokeless tobacco. She reports that she does not drink alcohol or use drugs.   Family History:  The patient's family history includes Heart disease in her mother; Hypertension in her father and mother.    ROS:  Please see the history of present illness.    Otherwise, review of systems are positive for none.   All other systems are reviewed and negative.    PHYSICAL EXAM: VS:  BP (!) 150/64   Pulse (!) 56   Ht 5\' 4"  (1.626 m)   Wt 165 lb 12.8 oz (75.2 kg)   SpO2 98%   BMI 28.46 kg/m  , BMI Body mass index is 28.46 kg/m. GEN: Well nourished, well developed, in no acute distress HEENT: normal Neck: no JVD, carotid bruits, or masses Cardiac: bradycardic, regular rhythm; +murmur, no rubs or gallops, no edema; right radial cath site without ecchymosis or hematoma  Respiratory:  clear to auscultation bilaterally, normal work of breathing GI: soft, nontender, nondistended, + BS MS: no deformity or atrophy Skin: warm and dry, no rash Neuro:  Strength and sensation are intact Psych: euthymic mood, full affect   EKG:  EKG is ordered today. The ekg ordered today demonstrates sinus bradycardia with no STE/D, no TWI.    Recent Labs: 02/27/2018: ALT 26; TSH 2.145 03/01/2018: BUN 15; Creatinine, Ser 0.99; Hemoglobin 11.9; Platelets 202; Potassium 4.2; Sodium 138    Lipid Panel    Component Value Date/Time   CHOL 132 02/28/2018 0408   TRIG 96 02/28/2018 0408   HDL 40 (L) 02/28/2018 0408   CHOLHDL 3.3 02/28/2018 0408   VLDL 19 02/28/2018 0408   LDLCALC 73 02/28/2018 0408      Wt Readings from Last 3 Encounters:  03/15/18 165 lb 12.8 oz (75.2 kg)  02/27/18 160 lb 15 oz (73 kg)  12/12/13 140 lb (63.5 kg)      Other studies Reviewed: Additional studies/ records that were reviewed today include:   Echocardiogram 02/28/18: Study Conclusions  - Left ventricle: The cavity size was normal. Wall thickness was normal. Systolic function was normal. The estimated ejection fraction was in the range of 60% to 65%. Wall motion was normal; there were no regional wall motion abnormalities. Doppler parameters are consistent with abnormal left ventricular relaxation (grade 1 diastolic dysfunction). The E/e&' ratio is between  8-15, suggesting indeterminate LV filling pressure. - Mitral valve: Mildly thickened leaflets . There was trivial regurgitation. - Left atrium: The atrium was normal in size. - Tricuspid valve: There was trivial regurgitation. - Pulmonic valve: There was mild regurgitation. - Pulmonary arteries: PA peak pressure: 25 mm Hg (S). - Inferior vena cava: The vessel was normal in size. The respirophasic diameter changes were in the normal range (= 50%), consistent with normal central venous pressure.  Impressions:  - LVEF 60-65%, normal wall thickness, normal wall motion, grade 1 DD, indeterminate LV filling pressure, trivial MR, normal LA size, mild PI, trivial TR, RVSP 25 mmHg, normal IVC.  Left heart catheterization 02/28/18: IMPRESSION:Deanna Compton has normal coronary arteries and normal LVEDP. I do not think the etiology of her chest pain or shortness of breath or cardiovascular in origin. The sheath was removed and a TR band was placed on the right wrist to achieve patent hemostasis. The patient left the lab in stable condition. Dr. Dietrich Pates was notified of these results.    ASSESSMENT AND PLAN:  1. Chest pain: LHC 02/2018 without CAD, chest pain felt to be GI related. She reports resolution with the addition of pepcid 20mg  BID.   - Continue pepcid 20mg  BID and management per PCP  2. HTN: BP 150/64 today on metoprolol 12.5mg  BID and telmisartan 80mg  daily. Was well controlled at PCP visit 03/07/18. She reports some dietary indiscretion with sodium intake and decreased activity. She is bradycardic so unable to titrate metoprolol and telmisartan at max dose.  - Recommend dietary and lifestyle modifications prior to adding additional medications for BP control. Patient was educated on the importance of maintaining a low sodium diet and exercising 5 days per week.  - Will defer further management of BP to her PCP going forward  3. HLD: LDL 73 02/2018. Atorvastatin decreased  to 20mg  daily at discharge given reassuring LHC findings.  - Continue atorvastatin 20mg  daily   Current medicines are reviewed at length with the patient today.  The patient does not have concerns regarding medicines.  The following changes have been made:  no change  Labs/ tests ordered today include: None  Orders Placed This Encounter  Procedures  . EKG 12-Lead     Disposition:   FU with Dr. Tenny Craw as needed   Signed, Beatriz Stallion, PA-C  03/15/2018 9:35 AM

## 2018-03-24 ENCOUNTER — Telehealth: Payer: Self-pay | Admitting: Internal Medicine

## 2018-03-24 DIAGNOSIS — R1013 Epigastric pain: Secondary | ICD-10-CM

## 2018-03-24 DIAGNOSIS — K30 Functional dyspepsia: Secondary | ICD-10-CM

## 2018-03-24 DIAGNOSIS — R112 Nausea with vomiting, unspecified: Secondary | ICD-10-CM

## 2018-03-24 NOTE — Telephone Encounter (Signed)
Spoke with the patient, she went to PCP and they prescribed Pepcid and she took it and she believes it has not helped. She vomited last night a 9 pm, one time. She had indigestion and a burning sensation in her center of her sternum. [facility] and did not believe this was the correct option. She requested a referral to GI doctor to assist instead.  Sending to Dr. Tenny Crawoss.

## 2018-03-24 NOTE — Telephone Encounter (Signed)
New Message   Patient is calling because she is still having episodes of abdominal pain and vomiting. She is wanting to see about getting a referral to a gastroenterologist. Please call to discuss.

## 2018-03-25 NOTE — Telephone Encounter (Signed)
Refer to  GI 

## 2018-03-25 NOTE — Telephone Encounter (Signed)
Referral placed for [facility]. Left message on VM for patient to call back to inform.

## 2018-03-28 NOTE — Telephone Encounter (Signed)
Follow up ° °Pt returning call for nurse °

## 2018-03-28 NOTE — Telephone Encounter (Signed)
Informed patient of referral to GI and that she will receive a call from that office.  She is appreciative for the information.

## 2018-06-20 ENCOUNTER — Other Ambulatory Visit (HOSPITAL_COMMUNITY): Payer: Self-pay | Admitting: Medical

## 2018-08-06 ENCOUNTER — Other Ambulatory Visit: Payer: Self-pay | Admitting: Medical

## 2018-10-29 ENCOUNTER — Emergency Department (HOSPITAL_BASED_OUTPATIENT_CLINIC_OR_DEPARTMENT_OTHER): Payer: Medicare Other

## 2018-10-29 ENCOUNTER — Other Ambulatory Visit: Payer: Self-pay

## 2018-10-29 ENCOUNTER — Emergency Department (HOSPITAL_BASED_OUTPATIENT_CLINIC_OR_DEPARTMENT_OTHER)
Admission: EM | Admit: 2018-10-29 | Discharge: 2018-10-29 | Disposition: A | Discharge status: Home / Self Care | Payer: Medicare Other | Attending: Emergency Medicine | Admitting: Emergency Medicine

## 2018-10-29 ENCOUNTER — Encounter (HOSPITAL_BASED_OUTPATIENT_CLINIC_OR_DEPARTMENT_OTHER): Payer: Self-pay | Admitting: Emergency Medicine

## 2018-10-29 DIAGNOSIS — Y999 Unspecified external cause status: Secondary | ICD-10-CM | POA: Insufficient documentation

## 2018-10-29 DIAGNOSIS — Y929 Unspecified place or not applicable: Secondary | ICD-10-CM | POA: Diagnosis not present

## 2018-10-29 DIAGNOSIS — S6981XA Other specified injuries of right wrist, hand and finger(s), initial encounter: Secondary | ICD-10-CM | POA: Diagnosis present

## 2018-10-29 DIAGNOSIS — I10 Essential (primary) hypertension: Secondary | ICD-10-CM | POA: Insufficient documentation

## 2018-10-29 DIAGNOSIS — S63501A Unspecified sprain of right wrist, initial encounter: Secondary | ICD-10-CM | POA: Insufficient documentation

## 2018-10-29 DIAGNOSIS — W19XXXA Unspecified fall, initial encounter: Secondary | ICD-10-CM | POA: Diagnosis not present

## 2018-10-29 DIAGNOSIS — Y939 Activity, unspecified: Secondary | ICD-10-CM | POA: Insufficient documentation

## 2018-10-29 NOTE — ED Triage Notes (Signed)
Pt fell yesterday and caught herself on her right forearm/wrist. Experiencing pain and swelling.

## 2018-10-29 NOTE — ED Provider Notes (Signed)
[facility] Emergency Department Provider Note MRN:  [id number]  Arrival date & time: 10/29/18     Chief Complaint   Fall and Arm Pain   History of Present Illness   Deanna Compton is a 73 y.o. year-old female with a history of hypertension presenting to the ED with chief complaint of fall and arm pain.  Patient explains that she was in her closet trying to get on a pair of Capri pants when she lost her balance and began falling.  She attempted to grab things trying to brace her fall but was unsuccessful, fell onto her outstretched right hand.  Denies head trauma, no loss of consciousness, denies neck or back pain, no chest pain or shortness of breath, no abdominal pain, denies anticoagulation.  Isolated right wrist pain.  Moderate in severity, worse with motion or palpation.  Review of Systems  A complete 10 system review of systems was obtained and all systems are negative except as noted in the HPI and PMH.   Patient's Health History    Past Medical History:  Diagnosis Date  . Arthritis   . Glaucoma   . Hyperlipemia   . Hypertension     Past Surgical History:  Procedure Laterality Date  . ABDOMINAL HYSTERECTOMY    . APPENDECTOMY    . EYE SURGERY    . LEFT HEART CATH AND CORONARY ANGIOGRAPHY N/A 02/28/2018   Procedure: LEFT HEART CATH AND CORONARY ANGIOGRAPHY;  Surgeon: Lorretta Harp, MD;  Location: [place] CV LAB;  Service: Cardiovascular;  Laterality: N/A;  . ORIF ELBOW FRACTURE Left 12/12/2013   Procedure: OPEN REDUCTION INTERNAL FIXATION (ORIF) ELBOW/OLECRANON FRACTURE;  Surgeon: Roseanne Kaufman, MD;  Location: [place];  Service: Orthopedics;  Laterality: Left;  . TONSILLECTOMY      Family History  Problem Relation Age of Onset  . Hypertension Mother   . Heart disease Mother   . Hypertension Father     Social History   Socioeconomic History  . Marital status: Married    Spouse name: Not on file  . Number of children: Not on file  .  Years of education: Not on file  . Highest education level: Not on file  Occupational History  . Not on file  Social Needs  . Financial resource strain: Not on file  . Food insecurity    Worry: Not on file    Inability: Not on file  . Transportation needs    Medical: Not on file    Non-medical: Not on file  Tobacco Use  . Smoking status: Never Smoker  . Smokeless tobacco: Never Used  Substance and Sexual Activity  . Alcohol use: No  . Drug use: No  . Sexual activity: Never  Lifestyle  . Physical activity    Days per week: Not on file    Minutes per session: Not on file  . Stress: Not on file  Relationships  . Social Herbalist on phone: Not on file    Gets together: Not on file    Attends religious service: Not on file    Active member of club or organization: Not on file    Attends meetings of clubs or organizations: Not on file    Relationship status: Not on file  . Intimate partner violence    Fear of current or ex partner: Not on file    Emotionally abused: Not on file    Physically abused: Not on file  Forced sexual activity: Not on file  Other Topics Concern  . Not on file  Social History Narrative  . Not on file     Physical Exam  Vital Signs and Nursing Notes reviewed Vitals:   10/29/18 0901  BP: (!) 138/58  Pulse: 68  Resp: 16  Temp: 97.9 F (36.6 C)  SpO2: 98%    CONSTITUTIONAL: Well-appearing, NAD NEURO:  Alert and oriented x 3, no focal deficits EYES:  eyes equal and reactive ENT/NECK:  no LAD, no JVD CARDIO: Regular rate, well-perfused, normal S1 and S2 PULM:  CTAB no wheezing or rhonchi GI/GU:  normal bowel sounds, non-distended, non-tender MSK/SPINE:  No gross deformities, no edema; tenderness palpation to the right distal radius, no snuffbox tenderness SKIN:  no rash, atraumatic PSYCH:  Appropriate speech and behavior  Diagnostic and Interventional Summary    Labs Reviewed - No data to display  DG Wrist Complete Right     (Results Pending)    Medications - No data to display   Procedures Critical Care  ED Course and Medical Decision Making  I have reviewed the triage vital signs and the nursing notes.  Pertinent labs & imaging results that were available during my care of the patient were reviewed by me and considered in my medical decision making (see below for details).  X-ray to exclude distal radius fracture, if negative will advise over-the-counter splint and anti-inflammatories for treatment of sprain.  If fracture is present, will splint and ensure orthopedic follow-up.  Patient is neurovascularly intact, no gross deformity.  After the discussed management above, the patient was determined to be safe for discharge.  The patient was in agreement with this plan and all questions regarding their care were answered.  ED return precautions were discussed and the patient will return to the ED with any significant worsening of condition.  Elmer SowMichael M. Pilar PlateBero, MD [facility] [email or web address]  Final Clinical Impressions(s) / ED Diagnoses     ICD-10-CM   1. Sprain of right wrist, initial encounter  S63.501A     ED Discharge Orders    None         Sabas SousBero, Natale Barba M, MD 10/29/18 [phone]

## 2018-10-29 NOTE — Discharge Instructions (Addendum)
You were evaluated in the Emergency Department and after careful evaluation, we did not find any emergent condition requiring admission or further testing in the hospital.  Your symptoms today seem to be due to a sprain of the wrist.  Your x-ray was normal and did not show any broken bones.  We recommend Tylenol and ibuprofen at home for discomfort.  We also recommend an over-the-counter wrist splint, such as one used for carpal tunnel support.  This will help immobilize the wrist and allow it to heal.  We recommend resting this wrist for 1 week.  Please return to the Emergency Department if you experience any worsening of your condition.  We encourage you to follow up with a primary care provider.  Thank you for allowing [place] to be a part of your care.

## 2019-05-05 ENCOUNTER — Ambulatory Visit: Payer: Medicare PPO | Attending: Internal Medicine

## 2019-05-05 DIAGNOSIS — Z23 Encounter for immunization: Secondary | ICD-10-CM

## 2019-05-05 NOTE — Progress Notes (Signed)
   Covid-19 Vaccination Clinic  Name:  Ellasyn Swilling    MRN: [id number] DOB: Jan 01, 1946  05/05/2019  Ms. Ecklund was observed post Covid-19 immunization for 15 minutes without incidence. She was provided with Vaccine Information Sheet and instruction to access the V-Safe system.   Ms. Knoff was instructed to call 911 with any severe reactions post vaccine: Marland Kitchen Difficulty breathing  . Swelling of your face and throat  . A fast heartbeat  . A bad rash all over your body  . Dizziness and weakness    Immunizations Administered    Name Date Dose VIS Date Route   Pfizer COVID-19 Vaccine 05/05/2019  5:27 PM 0.3 mL 03/24/2019 Intramuscular   Manufacturer: [employer], [employer]   Lot: [id number]   NDC: [address]-8

## 2019-05-12 ENCOUNTER — Ambulatory Visit: Payer: Medicare Other

## 2019-05-23 ENCOUNTER — Ambulatory Visit: Payer: Medicare Other

## 2019-05-26 ENCOUNTER — Ambulatory Visit: Payer: Medicare PPO | Attending: Internal Medicine

## 2019-05-26 DIAGNOSIS — Z23 Encounter for immunization: Secondary | ICD-10-CM | POA: Insufficient documentation

## 2019-05-26 NOTE — Progress Notes (Signed)
   Covid-19 Vaccination Clinic  Name:  Tandy Grawe    MRN: [id number] DOB: 11/24/1945  05/26/2019  Ms. Stough was observed post Covid-19 immunization for 15 minutes without incidence. She was provided with Vaccine Information Sheet and instruction to access the V-Safe system.   Ms. Macari was instructed to call 911 with any severe reactions post vaccine: Marland Kitchen Difficulty breathing  . Swelling of your face and throat  . A fast heartbeat  . A bad rash all over your body  . Dizziness and weakness    Immunizations Administered    Name Date Dose VIS Date Route   Pfizer COVID-19 Vaccine 05/26/2019 10:44 AM 0.3 mL 03/24/2019 Intramuscular   Manufacturer: [employer], [employer]   Lot: [id number]   NDC: [address]-8

## 2020-02-06 IMAGING — DX RIGHT WRIST - COMPLETE 3+ VIEW
4 series · 4 of 4 positions shown · non-contrast
Comparison: None.

CLINICAL DATA: C/o FOOSH injury to Rt wrist yesterday after falling
in her [place]. Has pain on anterior side of Rt wrist

EXAM:
RIGHT WRIST - COMPLETE 3+ VIEW

[wrist ap (1 of 2)]
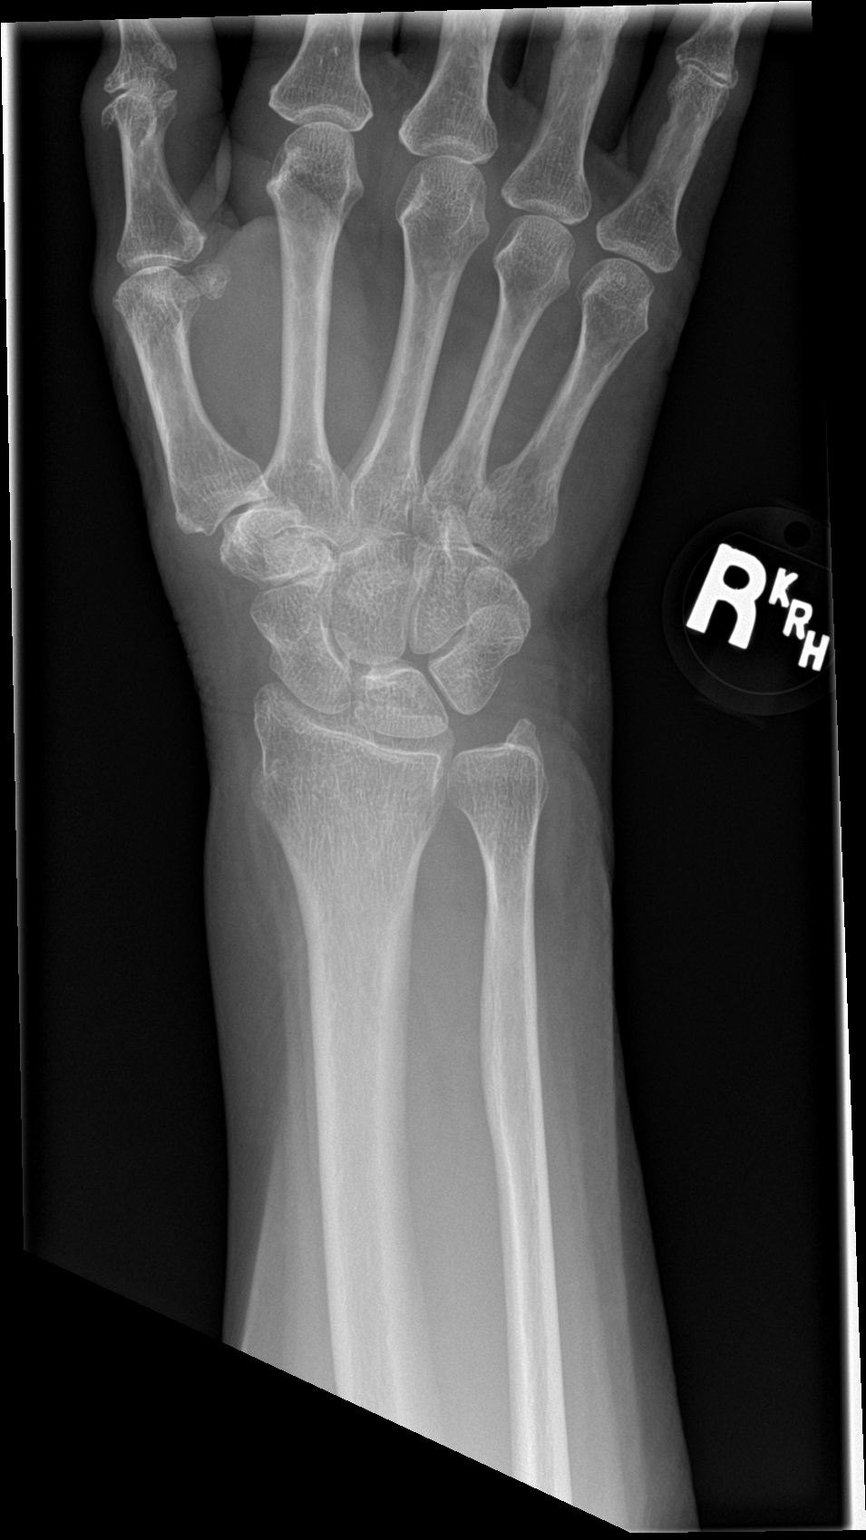

[wrist obl]
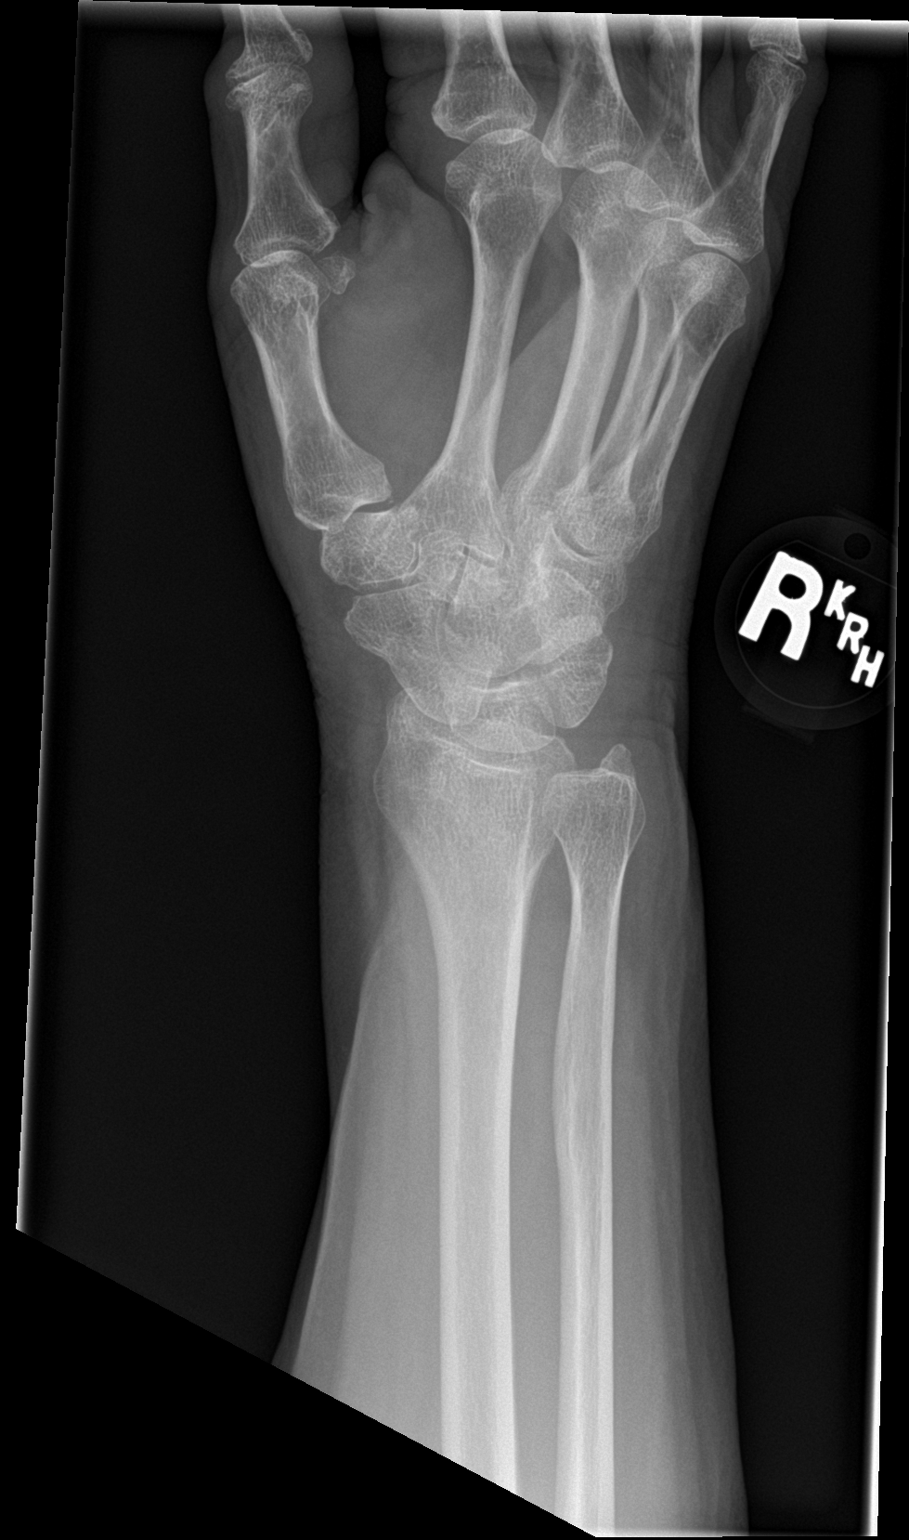

[wrist lat]
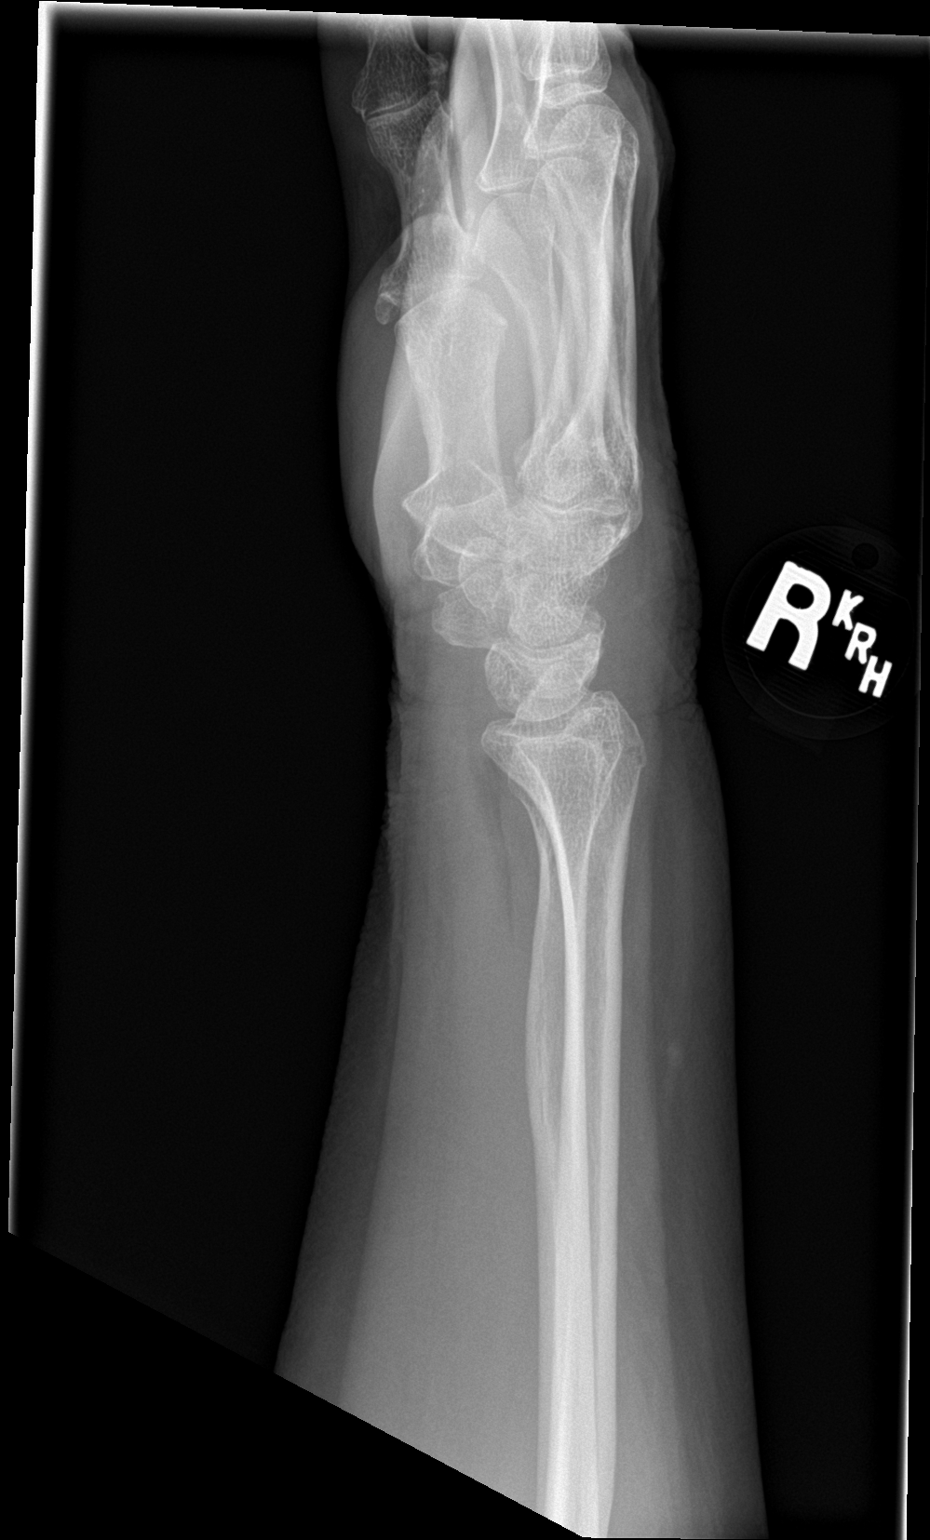

[wrist ap (2 of 2)]
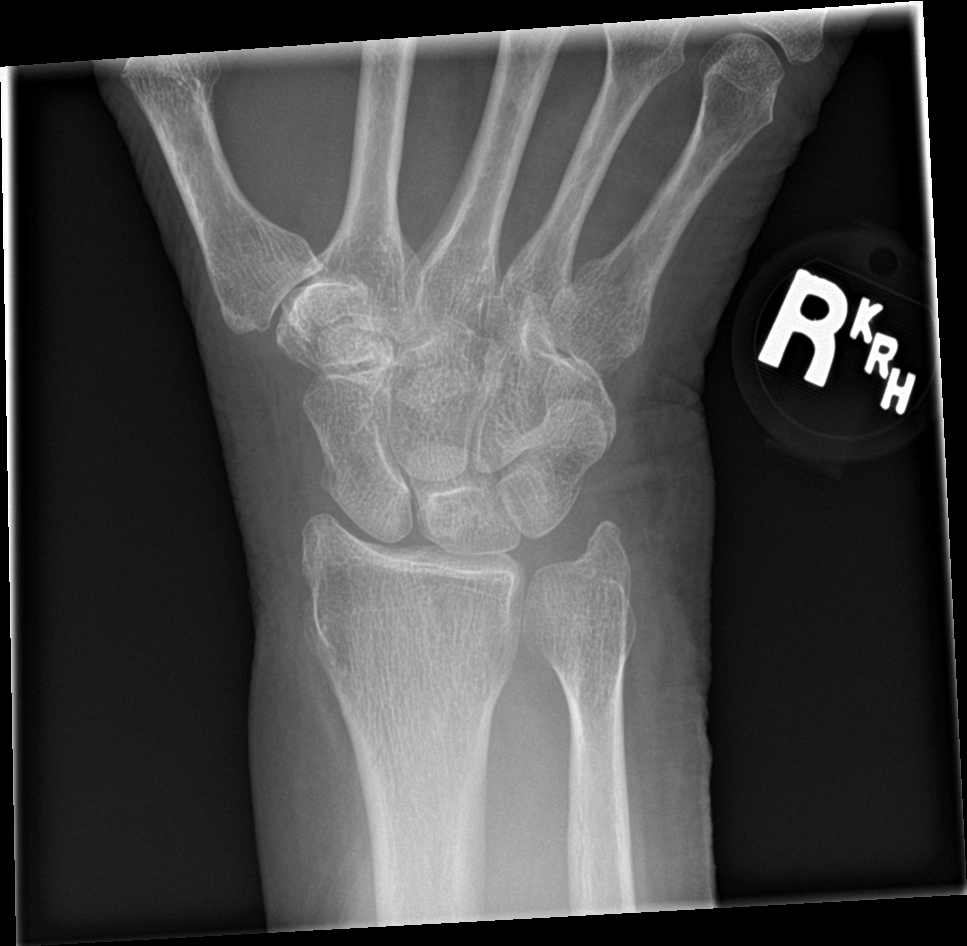

[4 of 4 positions shown; findings below may reference images not displayed]

FINDINGS: There is no evidence of fracture or dislocation. Mild degenerative
change at the first CMC joint. Soft tissues about the RIGHT wrist
are unremarkable.
IMPRESSION: No acute findings. No fracture or dislocation.

## 2020-12-03 ENCOUNTER — Ambulatory Visit: Payer: Medicare PPO | Attending: Orthopaedic Surgery | Admitting: Physical Therapy

## 2020-12-03 ENCOUNTER — Other Ambulatory Visit: Payer: Self-pay

## 2020-12-03 ENCOUNTER — Encounter: Payer: Self-pay | Admitting: Physical Therapy

## 2020-12-03 DIAGNOSIS — M6281 Muscle weakness (generalized): Secondary | ICD-10-CM | POA: Diagnosis present

## 2020-12-03 DIAGNOSIS — M545 Low back pain, unspecified: Secondary | ICD-10-CM | POA: Diagnosis present

## 2020-12-03 DIAGNOSIS — R262 Difficulty in walking, not elsewhere classified: Secondary | ICD-10-CM | POA: Diagnosis present

## 2020-12-03 DIAGNOSIS — M542 Cervicalgia: Secondary | ICD-10-CM | POA: Diagnosis present

## 2020-12-03 DIAGNOSIS — G8929 Other chronic pain: Secondary | ICD-10-CM | POA: Diagnosis present

## 2020-12-03 DIAGNOSIS — R252 Cramp and spasm: Secondary | ICD-10-CM | POA: Insufficient documentation

## 2020-12-03 NOTE — Addendum Note (Signed)
Addended by: Jena Gauss on: 12/03/2020 05:03 PM   Modules accepted: Orders

## 2020-12-03 NOTE — Therapy (Signed)
[facility] Outpatient Rehabilitation [facility] [address]  [address] [place], [place], [address] Phone: [phone]   Fax:  [phone]  Physical Therapy Evaluation  Patient Details  Name: Deanna Compton MRN: [id number] Date of Birth: 30-Jun-1945 Referring Provider (PT): Patricia Nettle   Encounter Date: 12/03/2020   PT End of Session - 12/03/20 1647     Visit Number 1    Number of Visits 12    Date for PT Re-Evaluation 01/14/21    Authorization Type [employer] MCR    Progress Note Due on Visit 10    PT Start Time 1445    PT Stop Time 1530    PT Time Calculation (min) 45 min    Activity Tolerance Patient tolerated treatment well    Behavior During Therapy [facility] for tasks assessed/performed             Past Medical History:  Diagnosis Date   Arthritis    Glaucoma    Hyperlipemia    Hypertension     Past Surgical History:  Procedure Laterality Date   ABDOMINAL HYSTERECTOMY     APPENDECTOMY     EYE SURGERY     LEFT HEART CATH AND CORONARY ANGIOGRAPHY N/A 02/28/2018   Procedure: LEFT HEART CATH AND CORONARY ANGIOGRAPHY;  Surgeon: Runell Gess, MD;  Location: [facility] INVASIVE CV LAB;  Service: Cardiovascular;  Laterality: N/A;   ORIF ELBOW FRACTURE Left 12/12/2013   Procedure: OPEN REDUCTION INTERNAL FIXATION (ORIF) ELBOW/OLECRANON FRACTURE;  Surgeon: Dominica Severin, MD;  Location: [facility] OR;  Service: Orthopedics;  Laterality: Left;   TONSILLECTOMY      There were no vitals filed for this visit.    Subjective Assessment - 12/03/20 1454     Subjective Pt. reports history of neck and back pain.  Had been receiving PT in the past for it, but had to stop due to other medical concerns.  She did get 4 cortisone shots a month ago, which helped but noticed last week it is getting tighter again.  Has been compliant with previously provided HEP.    Pertinent History chronic neck, back pain.  Recent eye surgery.  R TKA.  HTN    Limitations Walking;Standing;Lifting;Reading     How long can you walk comfortably? 10 min    Patient Stated Goals get rid of stiffness, more mobility    Currently in Pain? Yes    Pain Score 5    worst 8-9/10   Pain Location Back    Pain Orientation Lower    Pain Descriptors / Indicators Sharp;Nagging    Pain Type Acute pain;Chronic pain    Pain Radiating Towards L groin pain may be related to back pain    Pain Onset Other (comment)   several years   Pain Frequency Constant    Aggravating Factors  bending, vacuuming, cleaning bathtub    Pain Relieving Factors resting, stretching    Effect of Pain on Daily Activities hard to clean    Multiple Pain Sites Yes    Pain Score 0    Pain Location Neck    Pain Orientation Medial    Pain Descriptors / Indicators Tightness    Pain Type Chronic pain    Pain Onset Other (comment)   years   Pain Frequency Intermittent    Aggravating Factors  turning head, reading    Pain Relieving Factors cortisone shots    Effect of Pain on Daily Activities difficulty with bible study  [facility] PT Assessment - 12/03/20 0001       Assessment   Medical Diagnosis M50.320 Other cervical disc degeneration    Referring Provider (PT) Max Dalene Seltzer    Onset Date/Surgical Date --   chronic years   Hand Dominance Right    Prior Therapy yes, OPPT for both neck and back      Precautions   Precautions None      Balance Screen   Has the patient fallen in the past 6 months No    Has the patient had a decrease in activity level because of a fear of falling?  No    Is the patient reluctant to leave their home because of a fear of falling?  No      Home [employer]ist information centre manager residence      Prior Function   Level of Independence Independent    Vocation Retired    Leisure reading, bible study      Cognition   Overall Cognitive Status Within Functional Limits for tasks assessed      Observation/Other Assessments   Observations patient enterns independently with no  apparent distress.  No AD, no gait deviation.    Focus on Therapeutic Outcomes (FOTO)  neck 52; risk adjusted 46      Posture/Postural Control   Posture/Postural Control Postural limitations    Postural Limitations Rounded Shoulders;Forward head      ROM / Strength   AROM / PROM / Strength AROM;Strength      AROM   Overall AROM  Deficits;Due to pain    AROM Assessment Site Cervical;Lumbar    Cervical Flexion 45    Cervical Extension 25    Cervical - Right Side Bend 10    Cervical - Left Side Bend 20    Cervical - Right Rotation 45    Cervical - Left Rotation 60    Lumbar Flexion full - to floor, no pain    Lumbar Extension limited, increased midline back pain    Lumbar - Right Side Bend no deficit    Lumbar - Left Side Bend no deficit    Lumbar - Right Rotation no deficit    Lumbar - Left Rotation no deficit      Strength   Overall Strength Within functional limits for tasks performed    Overall Strength Comments shoulder ROM limited by RTC dysfunction    Strength Assessment Site Shoulder;Elbow;Hip;Knee    Right/Left Shoulder Right;Left    Right Shoulder Flexion 4+/5    Right Shoulder ABduction 4+/5    Right Shoulder Horizontal ABduction 4+/5    Right Shoulder Horizontal ADduction 4+/5    Left Shoulder Flexion 4+/5    Left Shoulder ABduction 4+/5    Left Shoulder Internal Rotation 4+/5    Left Shoulder External Rotation 4+/5    Right/Left Elbow Right;Left    Right Elbow Flexion 4+/5    Left Elbow Flexion 4+/5    Right/Left Hip Right;Left    Right Hip Flexion 4+/5    Right Hip ABduction 4/5    Right Hip ADduction 4+/5    Left Hip Flexion 4/5    Left Hip ABduction 4/5    Left Hip ADduction 4+/5    Right/Left Knee Right;Left    Right Knee Flexion 4+/5    Left Knee Flexion 4+/5      Palpation   Palpation comment tenderness, tightness throughout cervical paraspinals, UT, L/S (R >L)  Objective measurements completed on  examination: See above findings.               PT Education - 12/03/20 1646     Education Details bring HEP to next session to review but discontinue exercises causing pain    Person(s) Educated Patient    Methods Explanation    Comprehension Verbalized understanding              PT Short Term Goals - 12/03/20 1653       PT SHORT TERM GOAL #1   Title Pt. will be independent with initial HEP    Time 2    Period Weeks    Status New    Target Date 12/17/20               PT Long Term Goals - 12/03/20 1654       PT LONG TERM GOAL #1   Title Pt. will be compliant with progressed HEP    Time 6    Period Weeks    Status New    Target Date 01/14/21      PT LONG TERM GOAL #2   Title Pt. will demonstrate improved cervical ROM with 60 deg rotation bil for safety with driving.    Baseline see flowsheet    Time 6    Period Weeks    Status New    Target Date 01/14/21      PT LONG TERM GOAL #3   Title Pt. will report no more than 4/10 LBP with activities like cleaning.    Time 6    Period Weeks    Status New    Target Date 01/14/21      PT LONG TERM GOAL #4   Title Pt. will be able to walk without limitation from LBP.    Time 6    Period Weeks    Status New    Target Date 01/14/21                    Plan - 12/03/20 1649     Clinical Impression Statement Pt. is a 75 year old female with history of chronic neck and back pain.  She reports improvement after cortisone shots but still tightness in neck, especially with rotation to R.  Her back pain is also gradually increasing, and these are limiting her mobility and ability to perform ADLs like reading and walking dog.  Today she demonstrates pain, muscle tightness, and decreased cervical and lumbar ROM.  She would benefit from skilled physical therapy to address these deficits.    Personal Factors and Comorbidities Age;Comorbidity 3+;Time since onset of injury/illness/exacerbation     Comorbidities HTN, history RTA, chronic neck and back pain.    Examination-Activity Limitations Lift;Carry;Locomotion Level;Reach Overhead    Examination-Participation Restrictions Church;Cleaning;Community Activity    Stability/Clinical Decision Making Evolving/Moderate complexity    Clinical Decision Making Moderate    Rehab Potential Good    PT Frequency 2x / week    PT Duration 6 weeks    PT Treatment/Interventions ADLs/Self Care Home Management;Cryotherapy;Electrical Stimulation;Moist Heat;Traction;Ultrasound;Gait training;Stair training;Functional mobility training;Therapeutic activities;Therapeutic exercise;Neuromuscular re-education;Manual techniques;Passive range of motion;Dry needling;Taping;Spinal Manipulations;Joint Manipulations    PT Next Visit Plan review HEP, core strengthening, manual therapy    Consulted and Agree with Plan of Care Patient             Patient will benefit from skilled therapeutic intervention in order to improve the following deficits and impairments:  Decreased  activity tolerance, Decreased endurance, Decreased range of motion, Decreased strength, Impaired UE functional use, Improper body mechanics, Pain, Decreased mobility, Difficulty walking, Increased muscle spasms, Postural dysfunction  Visit Diagnosis: Cervicalgia  Chronic midline low back pain without sciatica  Difficulty in walking, not elsewhere classified  Cramp and spasm  Muscle weakness (generalized)     Problem List Patient Active Problem List   Diagnosis Date Noted   Essential hypertension 03/01/2018   Hyperlipidemia 03/01/2018   Chest pain 03/01/2018   Unstable angina (HCC) 02/27/2018   Fracture dislocation of left elbow joint 12/13/2013    Jena Gauss PT, DPT 12/03/2020, 4:58 PM  [facility] Outpatient Rehabilitation [facility] [address]  [address] [place], [place], [address] Phone: [phone]   Fax:  [phone]  Name: Deanna Compton MRN:  [id number] Date of Birth: 09/23/1945

## 2020-12-09 ENCOUNTER — Other Ambulatory Visit: Payer: Self-pay

## 2020-12-09 ENCOUNTER — Encounter: Payer: Self-pay | Admitting: Physical Therapy

## 2020-12-09 ENCOUNTER — Ambulatory Visit: Payer: Medicare PPO | Admitting: Physical Therapy

## 2020-12-09 DIAGNOSIS — M545 Low back pain, unspecified: Secondary | ICD-10-CM

## 2020-12-09 DIAGNOSIS — G8929 Other chronic pain: Secondary | ICD-10-CM

## 2020-12-09 DIAGNOSIS — M6281 Muscle weakness (generalized): Secondary | ICD-10-CM

## 2020-12-09 DIAGNOSIS — R252 Cramp and spasm: Secondary | ICD-10-CM

## 2020-12-09 DIAGNOSIS — M542 Cervicalgia: Secondary | ICD-10-CM

## 2020-12-09 DIAGNOSIS — R262 Difficulty in walking, not elsewhere classified: Secondary | ICD-10-CM

## 2020-12-09 NOTE — Patient Instructions (Signed)
Access Code: [email or web address] URL: [email or web address] Date: 12/09/2020 Prepared by: Harrie Foreman  Exercises Supine Lower Trunk Rotation - 1 x daily - 7 x weekly - 1 sets - 10 reps Hooklying Single Knee to Chest Stretch - 1 x daily - 7 x weekly - 1 sets - 3 reps - 20 sec hold Supine Chin Tuck - 1 x daily - 7 x weekly - 1 sets - 5 reps - 10 sec hold Supine Cervical Rotation AROM on Pillow - 1 x daily - 7 x weekly - 1 sets - 10 reps Supine Bridge - 1 x daily - 7 x weekly - 2 sets - 10 reps Clamshell - 1 x daily - 7 x weekly - 2 sets - 10 reps Corner Pec Major Stretch - 1 x daily - 7 x weekly - 1 sets - 3 reps - 20sec hold Seated Cervical Retraction - 1 x daily - 7 x weekly - 1 sets - 3 reps Seated Shoulder Rolls - 1 x daily - 7 x weekly - 1 sets - 10 reps Standing Lumbar Extension at Wall - Forearms - 1 x daily - 7 x weekly - 1 sets - 10 reps Seated Flexion Stretch - 1 x daily - 7 x weekly - 1 sets - 3 reps - 20 hold Seated Assisted Cervical Rotation with Towel - 1 x daily - 7 x weekly - 1 sets - 3 reps Cervical Extension AROM with Strap - 1 x daily - 7 x weekly - 1 sets - 10 reps

## 2020-12-09 NOTE — Therapy (Signed)
[facility] Outpatient Rehabilitation [facility] [address]  [address] [place], [place], [address] Phone: [phone]   Fax:  [phone]  Physical Therapy Treatment  Patient Details  Name: Deanna Compton MRN: [id number] Date of Birth: Jun 15, 1945 Referring Provider (PT): Patricia Nettle   Encounter Date: 12/09/2020   PT End of Session - 12/09/20 0959     Visit Number 2    Number of Visits 12    Date for PT Re-Evaluation 01/14/21    Authorization Type [employer] MCR    Progress Note Due on Visit 10    PT Start Time 0802    PT Stop Time 0849    PT Time Calculation (min) 47 min    Activity Tolerance Patient tolerated treatment well    Behavior During Therapy [facility] for tasks assessed/performed             Past Medical History:  Diagnosis Date   Arthritis    Glaucoma    Hyperlipemia    Hypertension     Past Surgical History:  Procedure Laterality Date   ABDOMINAL HYSTERECTOMY     APPENDECTOMY     EYE SURGERY     LEFT HEART CATH AND CORONARY ANGIOGRAPHY N/A 02/28/2018   Procedure: LEFT HEART CATH AND CORONARY ANGIOGRAPHY;  Surgeon: Runell Gess, MD;  Location: [facility] INVASIVE CV LAB;  Service: Cardiovascular;  Laterality: N/A;   ORIF ELBOW FRACTURE Left 12/12/2013   Procedure: OPEN REDUCTION INTERNAL FIXATION (ORIF) ELBOW/OLECRANON FRACTURE;  Surgeon: Dominica Severin, MD;  Location: [facility] OR;  Service: Orthopedics;  Laterality: Left;   TONSILLECTOMY      There were no vitals filed for this visit.   Subjective Assessment - 12/09/20 0804     Subjective Pt. reports being very tired this [place], [place].  Back really hurt yesterday but thinks it from bending over and pulling weeks.  She has brought her previous HEP to review today.    Pertinent History chronic neck, back pain.  Recent eye surgery.  R TKA.  HTN    Patient Stated Goals get rid of stiffness, more mobility    Currently in Pain? Yes    Pain Score 6      Pain Location Back    Pain Orientation Lower    Pain Descriptors / Indicators Aching                               [facility] Adult PT Treatment/Exercise - 12/09/20 0001       Exercises   Exercises Neck;Lumbar      Neck Exercises: Machines for Strengthening   Nustep L5 x 6 min      Neck Exercises: Seated   Neck Retraction 10 reps    Shoulder Rolls Backwards;10 reps      Neck Exercises: Supine   Neck Retraction 10 reps;5 secs    Neck Retraction Limitations tactile cues    Cervical Rotation Both;5 reps    Cervical Rotation Limitations cues to press head into pillow while maintaing chin tuck.   Tactile cues      Neck Exercises: Marine scientist 3 reps;20 seconds    Corner Stretch Limitations corrected technique, lean shoulders in not back      Lumbar Exercises: Stretches   Active Hamstring Stretch Right;Left;5 reps    Active Hamstring Stretch Limitations no difficulty    Single Knee  to Chest Stretch Right;Left;3 reps;20 seconds    Single Knee to Chest Stretch Limitations cues to place hand behind knee to avoid knee pain.    Lower Trunk Rotation 3 reps;20 seconds    Pelvic Tilt 10 reps    Pelvic Tilt Limitations cues to slow down, otherwise no difficulty      Lumbar Exercises: Standing   Other Standing Lumbar Exercises lumbar extension at wall x 10      Lumbar Exercises: Seated   Other Seated Lumbar Exercises foward flexion 3 x 30 sec      Lumbar Exercises: Supine   Bridge Compliant;10 reps    Bridge Limitations cues to slow down      Lumbar Exercises: Sidelying   Clam 10 reps;Both    Clam Limitations cues for technique, do not allow hips to roll back                    PT Education - 12/09/20 0958     Education Details Access Code: [facility] Reviewed all exercises, correcting as needed, eliminating and organizing into groupings.    Person(s) Educated Patient    Methods Explanation;Demonstration;Verbal cues;Handout     Comprehension Verbalized understanding;Returned demonstration;Need further instruction              PT Short Term Goals - 12/03/20 1653       PT SHORT TERM GOAL #1   Title Pt. will be independent with initial HEP    Time 2    Period Weeks    Status New    Target Date 12/17/20               PT Long Term Goals - 12/03/20 1654       PT LONG TERM GOAL #1   Title Pt. will be compliant with progressed HEP    Time 6    Period Weeks    Status New    Target Date 01/14/21      PT LONG TERM GOAL #2   Title Pt. will demonstrate improved cervical ROM with 60 deg rotation bil for safety with driving.    Baseline see flowsheet    Time 6    Period Weeks    Status New    Target Date 01/14/21      PT LONG TERM GOAL #3   Title Pt. will report no more than 4/10 LBP with activities like cleaning.    Time 6    Period Weeks    Status New    Target Date 01/14/21      PT LONG TERM GOAL #4   Title Pt. will be able to walk without limitation from LBP.    Time 6    Period Weeks    Status New    Target Date 01/14/21                   Plan - 12/09/20 0959     Clinical Impression Statement [name] brought her old exercises today, so focus of session was on reviewing and correcting exercises.  Educated that these exercises should not hurt, so any exercise that does please stop and tell PT so can review form or substitute another exercise.   Also organized exercises for ease and discussed which exercises perform short sets frequently as needed (standing lumbar extension, shoulder rolls, seated chin tucks), and which exercises for daily HEP.  She would benefit from continued skilled therapy.    Personal Factors and Comorbidities Age;Comorbidity 3+;Time since  onset of injury/illness/exacerbation    Comorbidities HTN, history RTA, chronic neck and back pain.    Examination-Activity Limitations Lift;Carry;Locomotion Level;Reach Overhead    Examination-Participation Restrictions  Church;Cleaning;Community Activity    Stability/Clinical Decision Making Evolving/Moderate complexity    Rehab Potential Good    PT Frequency 2x / week    PT Duration 6 weeks    PT Treatment/Interventions ADLs/Self Care Home Management;Cryotherapy;Electrical Stimulation;Moist Heat;Traction;Ultrasound;Gait training;Stair training;Functional mobility training;Therapeutic activities;Therapeutic exercise;Neuromuscular re-education;Manual techniques;Passive range of motion;Dry needling;Taping;Spinal Manipulations;Joint Manipulations    PT Next Visit Plan review HEP, core strengthening, manual therapy    Consulted and Agree with Plan of Care Patient             Patient will benefit from skilled therapeutic intervention in order to improve the following deficits and impairments:  Decreased activity tolerance, Decreased endurance, Decreased range of motion, Decreased strength, Impaired UE functional use, Improper body mechanics, Pain, Decreased mobility, Difficulty walking, Increased muscle spasms, Postural dysfunction  Visit Diagnosis: Cervicalgia  Chronic midline low back pain without sciatica  Difficulty in walking, not elsewhere classified  Cramp and spasm  Muscle weakness (generalized)     Problem List Patient Active Problem List   Diagnosis Date Noted   Essential hypertension 03/01/2018   Hyperlipidemia 03/01/2018   Chest pain 03/01/2018   Unstable angina (HCC) 02/27/2018   Fracture dislocation of left elbow joint 12/13/2013    Jena Gauss PT, DPT 12/09/2020, 10:02 AM  [facility] [address]  [address] [place], [place], [address] Phone: [phone]   Fax:  [phone]  Name: Julie-Ann Vanmaanen MRN: [id number] Date of Birth: [birth year]

## 2020-12-11 ENCOUNTER — Ambulatory Visit: Payer: Medicare PPO | Admitting: Physical Therapy

## 2020-12-11 ENCOUNTER — Encounter: Payer: Self-pay | Admitting: Physical Therapy

## 2020-12-11 ENCOUNTER — Other Ambulatory Visit: Payer: Self-pay

## 2020-12-11 DIAGNOSIS — M542 Cervicalgia: Secondary | ICD-10-CM

## 2020-12-11 DIAGNOSIS — R252 Cramp and spasm: Secondary | ICD-10-CM

## 2020-12-11 DIAGNOSIS — R262 Difficulty in walking, not elsewhere classified: Secondary | ICD-10-CM

## 2020-12-11 DIAGNOSIS — M545 Low back pain, unspecified: Secondary | ICD-10-CM

## 2020-12-11 DIAGNOSIS — G8929 Other chronic pain: Secondary | ICD-10-CM

## 2020-12-11 DIAGNOSIS — M6281 Muscle weakness (generalized): Secondary | ICD-10-CM

## 2020-12-11 NOTE — Therapy (Signed)
[facility] [address]  [address] [place], [place], [address] Phone: [phone]   Fax:  [phone]  Physical Therapy Treatment  Patient Details  Name: Deanna Compton MRN: [id number] Date of Birth: 1945-07-10 Referring Provider (PT): Patricia Nettle   Encounter Date: 12/11/2020   PT End of Session - 12/11/20 1226     Visit Number 3    Number of Visits 12    Date for PT Re-Evaluation 01/14/21    Authorization Type [employer] MCR    Progress Note Due on Visit 10    PT Start Time 0845    PT Stop Time 0940    PT Time Calculation (min) 55 min    Activity Tolerance Patient tolerated treatment well;Patient limited by pain    Behavior During Therapy [facility] for tasks assessed/performed             Past Medical History:  Diagnosis Date   Arthritis    Glaucoma    Hyperlipemia    Hypertension     Past Surgical History:  Procedure Laterality Date   ABDOMINAL HYSTERECTOMY     APPENDECTOMY     EYE SURGERY     LEFT HEART CATH AND CORONARY ANGIOGRAPHY N/A 02/28/2018   Procedure: LEFT HEART CATH AND CORONARY ANGIOGRAPHY;  Surgeon: Runell Gess, MD;  Location: [facility] INVASIVE CV LAB;  Service: Cardiovascular;  Laterality: N/A;   ORIF ELBOW FRACTURE Left 12/12/2013   Procedure: OPEN REDUCTION INTERNAL FIXATION (ORIF) ELBOW/OLECRANON FRACTURE;  Surgeon: Dominica Severin, MD;  Location: [facility] OR;  Service: Orthopedics;  Laterality: Left;   TONSILLECTOMY      There were no vitals filed for this visit.   Subjective Assessment - 12/11/20 0846     Subjective Pt. reports more neck and back pain this morning, back more than neck.  She thinks she strained it this weekend picking up pots of peace lillies to water.    Pertinent History chronic neck, back pain.  Recent eye surgery.  R TKA.  HTN    Patient Stated Goals get rid of stiffness, more mobility    Currently in Pain? Yes    Pain Score 5     Pain Location Back    Pain Orientation Lower     Pain Descriptors / Indicators Aching    Multiple Pain Sites Yes    Pain Score 3    Pain Location Neck                               [facility] Adult PT Treatment/Exercise - 12/11/20 0001       Exercises   Exercises Neck;Lumbar      Neck Exercises: Machines for Strengthening   UBE (Upper Arm Bike) L1 3 min foward, 3 min back      Lumbar Exercises: Stretches   Lower Trunk Rotation 5 reps      Lumbar Exercises: Supine   Bridge 5 reps    Bridge Limitations with PPT, cues to avoid extension      Modalities   Modalities Moist Heat      Moist Heat Therapy   Number Minutes Moist Heat 10 Minutes    Moist Heat Location Cervical;Lumbar Spine      Manual Therapy   Manual Therapy Joint mobilization;Soft tissue mobilization;Myofascial release    Joint Mobilization UPA mobs to lumbar spine    Soft tissue mobilization IASTM with foam roller to bil QL,  glutes, and lumbar paraspinals.    Myofascial Release TPR R lumbar erector spinae, STM to cervical paraspinals                      PT Short Term Goals - 12/03/20 1653       PT SHORT TERM GOAL #1   Title Pt. will be independent with initial HEP    Time 2    Period Weeks    Status New    Target Date 12/17/20               PT Long Term Goals - 12/03/20 1654       PT LONG TERM GOAL #1   Title Pt. will be compliant with progressed HEP    Time 6    Period Weeks    Status New    Target Date 01/14/21      PT LONG TERM GOAL #2   Title Pt. will demonstrate improved cervical ROM with 60 deg rotation bil for safety with driving.    Baseline see flowsheet    Time 6    Period Weeks    Status New    Target Date 01/14/21      PT LONG TERM GOAL #3   Title Pt. will report no more than 4/10 LBP with activities like cleaning.    Time 6    Period Weeks    Status New    Target Date 01/14/21      PT LONG TERM GOAL #4   Title Pt. will be able to walk without limitation from LBP.    Time 6    Period  Weeks    Status New    Target Date 01/14/21                   Plan - 12/11/20 1227     Clinical Impression Statement Pt. reporting continued discomfort in low back and demonstrated decreased tolerance to exercise, so focused session on manual therapy to decrease pain.  She demonstrated palpable muscle spasms throughout bil erector spinae, R worse than L, and reported decreased pain after manual therapy, however prolonged position in prone aggravated neck, so placed in supine on MHP followed my STM to cervical paraspinals to relax neck followed by MHP to cervical region.  She reported decreased pain after interventions, and will benefit from continued skilled therapy.    Personal Factors and Comorbidities Age;Comorbidity 3+;Time since onset of injury/illness/exacerbation    Comorbidities HTN, history RTA, chronic neck and back pain.    Examination-Activity Limitations Lift;Carry;Locomotion Level;Reach Overhead    Examination-Participation Restrictions Church;Cleaning;Community Activity    Stability/Clinical Decision Making Evolving/Moderate complexity    Rehab Potential Good    PT Frequency 2x / week    PT Duration 6 weeks    PT Treatment/Interventions ADLs/Self Care Home Management;Cryotherapy;Electrical Stimulation;Moist Heat;Traction;Ultrasound;Gait training;Stair training;Functional mobility training;Therapeutic activities;Therapeutic exercise;Neuromuscular re-education;Manual techniques;Passive range of motion;Dry needling;Taping;Spinal Manipulations;Joint Manipulations    PT Next Visit Plan review HEP, core strengthening, manual therapy    Consulted and Agree with Plan of Care Patient             Patient will benefit from skilled therapeutic intervention in order to improve the following deficits and impairments:  Decreased activity tolerance, Decreased endurance, Decreased range of motion, Decreased strength, Impaired UE functional use, Improper body mechanics, Pain,  Decreased mobility, Difficulty walking, Increased muscle spasms, Postural dysfunction  Visit Diagnosis: Cervicalgia  Chronic midline low back pain without sciatica  Difficulty in walking, not elsewhere classified  Cramp and spasm  Muscle weakness (generalized)     Problem List Patient Active Problem List   Diagnosis Date Noted   Essential hypertension 03/01/2018   Hyperlipidemia 03/01/2018   Chest pain 03/01/2018   Unstable angina (HCC) 02/27/2018   Fracture dislocation of left elbow joint 12/13/2013    Jena Gauss PT, DPT 12/11/2020, 12:30 PM  [facility] [address]  [address] [place], [place], [address] Phone: [phone]   Fax:  [phone]  Name: Kavitha Lansdale MRN: [id number] Date of Birth: [birth year]

## 2020-12-18 ENCOUNTER — Encounter: Payer: Self-pay | Admitting: Physical Therapy

## 2020-12-18 ENCOUNTER — Ambulatory Visit: Payer: Medicare PPO | Attending: Orthopaedic Surgery | Admitting: Physical Therapy

## 2020-12-18 ENCOUNTER — Other Ambulatory Visit: Payer: Self-pay

## 2020-12-18 DIAGNOSIS — M6281 Muscle weakness (generalized): Secondary | ICD-10-CM | POA: Insufficient documentation

## 2020-12-18 DIAGNOSIS — M545 Low back pain, unspecified: Secondary | ICD-10-CM | POA: Insufficient documentation

## 2020-12-18 DIAGNOSIS — M542 Cervicalgia: Secondary | ICD-10-CM | POA: Diagnosis present

## 2020-12-18 DIAGNOSIS — G8929 Other chronic pain: Secondary | ICD-10-CM | POA: Diagnosis present

## 2020-12-18 DIAGNOSIS — R252 Cramp and spasm: Secondary | ICD-10-CM | POA: Insufficient documentation

## 2020-12-18 DIAGNOSIS — R262 Difficulty in walking, not elsewhere classified: Secondary | ICD-10-CM | POA: Diagnosis present

## 2020-12-18 NOTE — Therapy (Signed)
[facility] [address]  [address] [place], [place], [address] Phone: [phone]   Fax:  [phone]  Physical Therapy Treatment  Patient Details  Name: Deanna Compton MRN: [id number] Date of Birth: Jun 21, 1945 Referring Provider (PT): Patricia Nettle   Encounter Date: 12/18/2020   PT End of Session - 12/18/20 0851     Visit Number 4    Number of Visits 12    Date for PT Re-Evaluation 01/14/21    Authorization Type [employer] MCR    Progress Note Due on Visit 10    PT Start Time 0845    PT Stop Time 0935    PT Time Calculation (min) 50 min    Activity Tolerance Patient tolerated treatment well;Patient limited by pain    Behavior During Therapy [facility] for tasks assessed/performed             Past Medical History:  Diagnosis Date   Arthritis    Glaucoma    Hyperlipemia    Hypertension     Past Surgical History:  Procedure Laterality Date   ABDOMINAL HYSTERECTOMY     APPENDECTOMY     EYE SURGERY     LEFT HEART CATH AND CORONARY ANGIOGRAPHY N/A 02/28/2018   Procedure: LEFT HEART CATH AND CORONARY ANGIOGRAPHY;  Surgeon: Runell Gess, MD;  Location: [facility] INVAS[facility] CV LAB;  Service: Cardiovascular;  Laterality: N/A;   ORIF ELBOW FRACTURE Left 12/12/2013   Procedure: OPEN REDUCTION INTERNAL FIXATION (ORIF) ELBOW/OLECRANON FRACTURE;  Surgeon: Dominica Severin, MD;  Location: [facility] OR;  Service: Orthopedics;  Laterality: Left;   TONSILLECTOMY      There were no vitals filed for this visit.   Subjective Assessment - 12/18/20 0846     Subjective Pt. reports had a hectic busy weekend entertaining guests.  Reports back felt great after last session, until she bent over to pick up tray and back popped and spasmed, tight and sore for a few days, but then pain went away and feels much better overall.    Pertinent History chronic neck, back pain.  Recent eye surgery.  R TKA.  HTN    Patient Stated Goals get rid of stiffness, more mobility     Currently in Pain? Yes    Pain Score 4     Pain Location Neck    Pain Descriptors / Indicators Aching    Pain Score 2    Pain Location Back    Pain Orientation Lower                               [facility] Adult PT Treatment/Exercise - 12/18/20 0001       Exercises   Exercises Neck;Lumbar      Neck Exercises: Machines for Strengthening   Nustep L5 x 6 min      Neck Exercises: Supine   Neck Retraction 10 reps;5 secs    Cervical Rotation Both;5 reps    Cervical Rotation Limitations cues to press head into pillow while maintaing chin tuck.   Tactile cues      Modalities   Modalities Moist Heat      Moist Heat Therapy   Number Minutes Moist Heat 10 Minutes    Moist Heat Location Cervical;Lumbar Spine      Manual Therapy   Manual Therapy Joint mobilization;Soft tissue mobilization;Myofascial release;Manual Traction    Joint Mobilization PA mobs to cervical spine grade 2-3, NAGS into  rotation    Soft tissue mobilization STM to cervical paraspinals    Myofascial Release TPR to bil UT/LS    Manual Traction gentle pull and hold 4 x 15 sec                     PT Education - 12/18/20 1222     Education Details HEP update for cervical strengthening.    Person(s) Educated Patient    Methods Explanation;Demonstration;Handout    Comprehension Verbalized understanding;Returned demonstration              PT Short Term Goals - 12/03/20 1653       PT SHORT TERM GOAL #1   Title Pt. will be independent with initial HEP    Time 2    Period Weeks    Status New    Target Date 12/17/20               PT Long Term Goals - 12/03/20 1654       PT LONG TERM GOAL #1   Title Pt. will be compliant with progressed HEP    Time 6    Period Weeks    Status New    Target Date 01/14/21      PT LONG TERM GOAL #2   Title Pt. will demonstrate improved cervical ROM with 60 deg rotation bil for safety with driving.    Baseline see flowsheet    Time 6     Period Weeks    Status New    Target Date 01/14/21      PT LONG TERM GOAL #3   Title Pt. will report no more than 4/10 LBP with activities like cleaning.    Time 6    Period Weeks    Status New    Target Date 01/14/21      PT LONG TERM GOAL #4   Title Pt. will be able to walk without limitation from LBP.    Time 6    Period Weeks    Status New    Target Date 01/14/21                   Plan - 12/18/20 1223     Clinical Impression Statement Patient reports improvement in back pain overall and no longer having pain going down her leg.  Neck is still stiff, and would like to work on more neck strengthening exercises.  Today reviewed cervical retraction and rotation to strengthen and stretch cervical paraspinals, followed by manual therapy to cervical region.  Noted much less muscle tighness in region overall.  MHP to neck and back at end of session for patient comfort. Pt. would benefit from continued skilled therapy.    Personal Factors and Comorbidities Age;Comorbidity 3+;Time since onset of injury/illness/exacerbation    Comorbidities HTN, history RTA, chronic neck and back pain.    Examination-Activity Limitations Lift;Carry;Locomotion Level;Reach Overhead    Examination-Participation Restrictions Church;Cleaning;Community Activity    Stability/Clinical Decision Making Evolving/Moderate complexity    Rehab Potential Good    PT Frequency 2x / week    PT Duration 6 weeks    PT Treatment/Interventions ADLs/Self Care Home Management;Cryotherapy;Electrical Stimulation;Moist Heat;Traction;Ultrasound;Gait training;Stair training;Functional mobility training;Therapeutic activities;Therapeutic exercise;Neuromuscular re-education;Manual techniques;Passive range of motion;Dry needling;Taping;Spinal Manipulations;Joint Manipulations    PT Next Visit Plan review HEP, core strengthening, manual therapy    Consulted and Agree with Plan of Care Patient             Patient will  benefit from skilled therapeutic intervention in order to improve the following deficits and impairments:  Decreased activity tolerance, Decreased endurance, Decreased range of motion, Decreased strength, Impaired UE functional use, Improper body mechanics, Pain, Decreased mobility, Difficulty walking, Increased muscle spasms, Postural dysfunction  Visit Diagnosis: Cervicalgia  Chronic midline low back pain without sciatica  Difficulty in walking, not elsewhere classified  Cramp and spasm  Muscle weakness (generalized)     Problem List Patient Active Problem List   Diagnosis Date Noted   Essential hypertension 03/01/2018   Hyperlipidemia 03/01/2018   Chest pain 03/01/2018   Unstable angina (HCC) 02/27/2018   Fracture dislocation of left elbow joint 12/13/2013    Jena Gauss, PT DPT 12/18/2020, 12:27 PM  [facility] [address]  [address] [place], [place], [address] Phone: [phone]   Fax:  [phone]  Name: Deanna Compton MRN: [id number] Date of Birth: [birth year]

## 2020-12-18 NOTE — Patient Instructions (Signed)
Access Code: [email or web address] URL: [email or web address] Date: 12/18/2020 Prepared by: Harrie Foreman  Exercises Supine Cervical Retraction with Towel - 1 x daily - 7 x weekly - 2 sets - 10 reps - 5 sec hold Supine Cervical Rotation AROM on Pillow - 1 x daily - 7 x weekly - 2 sets - 10 reps

## 2020-12-20 ENCOUNTER — Encounter: Payer: Medicare PPO | Admitting: Physical Therapy

## 2020-12-23 ENCOUNTER — Other Ambulatory Visit: Payer: Self-pay

## 2020-12-23 ENCOUNTER — Encounter (HOSPITAL_BASED_OUTPATIENT_CLINIC_OR_DEPARTMENT_OTHER): Payer: Self-pay | Admitting: *Deleted

## 2020-12-23 ENCOUNTER — Emergency Department (HOSPITAL_BASED_OUTPATIENT_CLINIC_OR_DEPARTMENT_OTHER)
Admission: EM | Admit: 2020-12-23 | Discharge: 2020-12-23 | Disposition: A | Discharge status: Home / Self Care | Payer: Medicare PPO | Attending: Emergency Medicine | Admitting: Emergency Medicine

## 2020-12-23 ENCOUNTER — Emergency Department (HOSPITAL_BASED_OUTPATIENT_CLINIC_OR_DEPARTMENT_OTHER): Payer: Medicare PPO

## 2020-12-23 DIAGNOSIS — Z79899 Other long term (current) drug therapy: Secondary | ICD-10-CM | POA: Diagnosis not present

## 2020-12-23 DIAGNOSIS — I10 Essential (primary) hypertension: Secondary | ICD-10-CM | POA: Diagnosis not present

## 2020-12-23 DIAGNOSIS — R531 Weakness: Secondary | ICD-10-CM | POA: Insufficient documentation

## 2020-12-23 DIAGNOSIS — Z20822 Contact with and (suspected) exposure to covid-19: Secondary | ICD-10-CM | POA: Diagnosis not present

## 2020-12-23 DIAGNOSIS — R5383 Other fatigue: Secondary | ICD-10-CM | POA: Insufficient documentation

## 2020-12-23 DIAGNOSIS — R031 Nonspecific low blood-pressure reading: Secondary | ICD-10-CM

## 2020-12-23 LAB — CBC WITH DIFFERENTIAL/PLATELET
Abs Immature Granulocytes: 0.03 10*3/uL (ref 0.00–0.07)
Basophils Absolute: 0 10*3/uL (ref 0.0–0.1)
Basophils Relative: 1 %
Eosinophils Absolute: 0.1 10*3/uL (ref 0.0–0.5)
Eosinophils Relative: 1 %
HCT: 39.1 % (ref 36.0–46.0)
Hemoglobin: 12.9 g/dL (ref 12.0–15.0)
Immature Granulocytes: 1 %
Lymphocytes Relative: 25 %
Lymphs Abs: 1.6 10*3/uL (ref 0.7–4.0)
MCH: 31.5 pg (ref 26.0–34.0)
MCHC: 33 g/dL (ref 30.0–36.0)
MCV: 95.4 fL (ref 80.0–100.0)
Monocytes Absolute: 0.6 10*3/uL (ref 0.1–1.0)
Monocytes Relative: 9 %
Neutro Abs: 4.1 10*3/uL (ref 1.7–7.7)
Neutrophils Relative %: 63 %
Platelets: 301 10*3/uL (ref 150–400)
RBC: 4.1 MIL/uL (ref 3.87–5.11)
RDW: 14.1 % (ref 11.5–15.5)
WBC: 6.3 10*3/uL (ref 4.0–10.5)
nRBC: 0 % (ref 0.0–0.2)

## 2020-12-23 LAB — URINALYSIS, ROUTINE W REFLEX MICROSCOPIC
Bilirubin Urine: NEGATIVE
Glucose, UA: NEGATIVE mg/dL
Hgb urine dipstick: NEGATIVE
Ketones, ur: NEGATIVE mg/dL
Leukocytes,Ua: NEGATIVE
Nitrite: NEGATIVE
Protein, ur: NEGATIVE mg/dL
Specific Gravity, Urine: 1.005 (ref 1.005–1.030)
pH: 6 (ref 5.0–8.0)

## 2020-12-23 LAB — COMPREHENSIVE METABOLIC PANEL
ALT: 27 U/L (ref 0–44)
AST: 35 U/L (ref 15–41)
Albumin: 4.6 g/dL (ref 3.5–5.0)
Alkaline Phosphatase: 55 U/L (ref 38–126)
Anion gap: 12 (ref 5–15)
BUN: 30 mg/dL — ABNORMAL HIGH (ref 8–23)
CO2: 25 mmol/L (ref 22–32)
Calcium: 10.3 mg/dL (ref 8.9–10.3)
Chloride: 96 mmol/L — ABNORMAL LOW (ref 98–111)
Creatinine, Ser: 0.83 mg/dL (ref 0.44–1.00)
GFR, Estimated: >60 mL/min (ref >60)
Glucose, Bld: 88 mg/dL (ref 70–99)
Potassium: 4 mmol/L (ref 3.5–5.1)
Sodium: 133 mmol/L — ABNORMAL LOW (ref 135–145)
Total Bilirubin: 0.4 mg/dL (ref 0.3–1.2)
Total Protein: 8 g/dL (ref 6.5–8.1)

## 2020-12-23 LAB — TROPONIN I (HIGH SENSITIVITY): Troponin I (High Sensitivity): 4 ng/L (ref <18)

## 2020-12-23 LAB — RESP PANEL BY RT-PCR (FLU A&B, COVID) ARPGX2
Influenza A by PCR: NEGATIVE
Influenza B by PCR: NEGATIVE
SARS Coronavirus 2 by RT PCR: NEGATIVE

## 2020-12-23 LAB — BRAIN NATRIURETIC PEPTIDE: B Natriuretic Peptide: 11 pg/mL (ref 0.0–100.0)

## 2020-12-23 NOTE — Discharge Instructions (Addendum)
Please stop taking hydralazine  Please continue taking amlodipine  Please follow with your primary care physician for further management of hypertension

## 2020-12-23 NOTE — ED Provider Notes (Addendum)
[facility] EMERGENCY DEPARTMENT Provider Note   CSN: [id number] Arrival date & time: 12/23/20  1353     History Chief Complaint  Patient presents with   Hypotension    Deanna Compton is a 75 y.o. female.  Patient is a 75 yo female presenting for generalized unwellness. Pt states over the past 3 days she has had increasing fatigue and generalized weakness. States symptoms are mild but wanted to make sure nothing else was wrong. Denies chest pain or sob. Denies signs of infection including no fevers, chills, nausea, vomting, coughing, diarrhea. Denies bleeding, black or bloody stools.   The history is provided by the patient. No language interpreter was used.      Past Medical History:  Diagnosis Date   Arthritis    Glaucoma    Hyperlipemia    Hypertension     Patient Active Problem List   Diagnosis Date Noted   Essential hypertension 03/01/2018   Hyperlipidemia 03/01/2018   Chest pain 03/01/2018   Unstable angina (HCC) 02/27/2018   Fracture dislocation of left elbow joint 12/13/2013    Past Surgical History:  Procedure Laterality Date   ABDOMINAL HYSTERECTOMY     APPENDECTOMY     EYE SURGERY     LEFT HEART CATH AND CORONARY ANGIOGRAPHY N/A 02/28/2018   Procedure: LEFT HEART CATH AND CORONARY ANGIOGRAPHY;  Surgeon: Runell Gess, MD;  [facility]: [facility];  Service: Cardiovascular;  Laterality: N/A;   ORIF ELBOW FRACTURE Left 12/12/2013   Procedure: OPEN REDUCTION INTERNAL FIXATION (ORIF) ELBOW/OLECRANON FRACTURE;  Surgeon: Dominica Severin, MD;  Location: [facility] OR;  Service: Orthopedics;  Laterality: Left;   TONSILLECTOMY       OB History   No obstetric history on file.     Family History  Problem Relation Age of Onset   Hypertension Mother    Heart disease Mother    Hypertension Father     Social History   Tobacco Use   Smoking status: Never   Smokeless tobacco: Never  Substance Use Topics   Alcohol use: No   Drug use: No    Home  Medications Prior to Admission medications   Medication Sig Start Date End Date Taking? Authorizing Provider  acetaminophen (TYLENOL 8 HOUR ARTHRITIS PAIN) 650 MG CR tablet Take 650 mg by mouth every 8 (eight) hours as needed for pain. Takes 2 in AM, 2 in PM    [provider]  alendronate (FOSAMAX) 70 MG tablet Take 70 mg by mouth once a week. Take with a full glass of water on an empty stomach.    [provider]  amLODipine (NORVASC) 5 MG tablet Take 5 mg by mouth daily.    [provider]  atorvastatin (LIPITOR) 40 MG tablet Take 0.5 tablets (20 mg total) by mouth daily at 6 PM. 03/01/18   Kroeger, Ovidio Kin., PA-C  bimatoprost (LUMIGAN) 0.03 % ophthalmic solution Place 1 drop into both eyes at bedtime.     [provider]  Bioflavonoid Products (C COMPLEX) TBCR Take 1,000 mg by mouth daily. Patient not taking: Reported on 12/03/2020    [provider]  Calcium Carb-Cholecalciferol (CALTRATE 600+D3 PO) Take by mouth.    [provider]  calcium carbonate (OS-CAL - DOSED IN MG OF ELEMENTAL CALCIUM) 1250 MG tablet Take 1 tablet by mouth daily with breakfast. Patient not taking: Reported on 12/03/2020    [provider]  cholecalciferol (VITAMIN D) 1000 UNITS tablet Take 1,000 Units by mouth  daily. Patient not taking: Reported on 12/03/2020    [provider]  diclofenac Sodium (VOLTAREN) 1 % GEL Apply topically 4 (four) times daily. PRN    [provider]  famotidine (PEPCID) 20 MG tablet Take 20 mg by mouth 2 (two) times daily. Patient not taking: Reported on 12/03/2020    [provider]  furosemide (LASIX) 20 MG tablet Take 20 mg by mouth. PRN for leg swelling, take 3 days at a time in morning    [provider]  hydrALAZINE (APRESOLINE) 25 MG tablet Take 25 mg by mouth once. Patient reports only taking once daily in AM starting on 10/04/20    [provider]  Melatonin 10 MG TABS Take 20 mg  by mouth at bedtime.    [provider]  metoprolol tartrate (LOPRESSOR) 25 MG tablet TAKE 1/2 TABLET BY MOUTH TWICE DAILY 08/06/18   Kroeger, Ovidio Kin., PA-C  Multiple Vitamin (MULTIVITAMIN WITH MINERALS) TABS tablet Take 1 tablet by mouth daily.    [provider]  olmesartan (BENICAR) 40 MG tablet Take 40 mg by mouth daily.    [provider]  omega-3 acid ethyl esters (LOVAZA) 1 G capsule Take 1 g by mouth 3 (three) times daily.    [provider]  polyethylene glycol (MIRALAX / GLYCOLAX) packet Take 17 g by mouth daily as needed for mild constipation.     [provider]  potassium chloride SA (KLOR-CON) 20 MEQ tablet Take 20 mEq by mouth once. When taking Furosemide    [provider]  pregabalin (LYRICA) 75 MG capsule Take 75 mg by mouth 2 (two) times daily.    [provider]  solifenacin (VESICARE) 5 MG tablet Take 2.5 mg by mouth daily.     [provider]  telmisartan (MICARDIS) 80 MG tablet Take 80 mg by mouth daily. Patient not taking: Reported on 12/03/2020    [provider]  tiZANidine (ZANAFLEX) 4 MG tablet Take 4 mg by mouth once. At night if needed    [provider]  TURMERIC CURCUMIN PO Take 1 capsule by mouth daily.     [provider]    Allergies    Sulfa antibiotics  Review of Systems   Review of Systems  Constitutional:  Positive for fatigue. Negative for chills and fever.  HENT:  Negative for ear pain and sore throat.   Eyes:  Negative for pain and visual disturbance.  Respiratory:  Negative for cough and shortness of breath.   Cardiovascular:  Negative for chest pain and palpitations.  Gastrointestinal:  Negative for abdominal pain and vomiting.  Genitourinary:  Negative for dysuria and hematuria.  Musculoskeletal:  Negative for arthralgias and back pain.  Skin:  Negative for color change and rash.  Neurological:  Positive for weakness (generalized). Negative for  seizures and syncope.  All other systems reviewed and are negative.  Physical Exam Updated Vital Signs BP (!) 158/74 (BP Location: Right Arm)   Pulse 75   Temp 98.5 F (36.9 C) (Oral)   Resp 18   Ht 5\' 4"  (1.626 m)   Wt 72.6 kg   SpO2 97%   BMI 27.46 kg/m   Physical Exam Vitals and nursing note reviewed.  Constitutional:      General: She is not in acute distress.    Appearance: She is well-developed.  HENT:     Head: Normocephalic and atraumatic.  Eyes:     Conjunctiva/sclera: Conjunctivae normal.  Cardiovascular:  Rate and Rhythm: Normal rate and regular rhythm.     Heart sounds: No murmur heard. Pulmonary:     Effort: Pulmonary effort is normal. No respiratory distress.     Breath sounds: Normal breath sounds.  Abdominal:     Palpations: Abdomen is soft.     Tenderness: There is no abdominal tenderness.  Musculoskeletal:     Cervical back: Neck supple.  Skin:    General: Skin is warm and dry.  Neurological:     Mental Status: She is alert.     GCS: GCS eye subscore is 4. GCS verbal subscore is 5. GCS motor subscore is 6.     Cranial Nerves: Cranial nerves are intact.     Sensory: Sensation is intact.     Motor: Motor function is intact.     Coordination: Coordination is intact.     Gait: Gait is intact.    ED Results / Procedures / Treatments   Labs (all labs ordered are listed, but only abnormal results are displayed) Labs Reviewed  RESP PANEL BY RT-PCR (FLU A&B, COVID) ARPGX2  CBC WITH DIFFERENTIAL/PLATELET  COMPREHENSIVE METABOLIC PANEL  BRAIN NATRIURETIC PEPTIDE  TROPONIN I (HIGH SENSITIVITY)    EKG None  Radiology No results found.  Procedures Procedures   Medications Ordered in ED Medications - No data to display  ED Course  I have reviewed the triage vital signs and the nursing notes.  Pertinent labs & imaging results that were available during my care of the patient were reviewed by me and considered in my medical decision making  (see chart for details).    MDM Rules/Calculators/A&P                          5:32 PM 75 yo female presenting for generalized unwellness. Pt states over the past 3 days she has had increasing fatigue and generalized weakness. Pt is Aox3, no acute distress, afebrile, with stable vitals.   Generalized weakness/fatigue: -No neurovascular deficits. Low concern for stroke. -Stable EKG. No ST segment elevation or depression. Stable intervals. No bradycardia/heart blocks. Stable troponin, and cxr. BNP ordered to rule out fluid overloaded stated due to hx of echocardiogram demonstrating minimal mitral and tricuspid valve regurg and wnl.  -No hx of infectious signs/symptoms. No signs/symptoms of sepsis. -Stable electrolytes including Na, K, and renal function. -No signs/symptoms of sepsis -No hypoxia -No hx of bleeding. No anemia.  -Stable thyroid function.  -Negative Covid 19 PCR -No hx of medication changes/concerns for OD.  Hx of intermittent hypotension at home. Pt currently taking Atorvastatin and Hydralazine for HTN but has recently had 15 lb weight loss after clean eating. No hypotension at this time. Recommendations to hold Hydralazin and follow up with pcp for blood pressure recheck in 7 days. Recommendations to continue BP monitoring and documentation at home with medication change and bring to f/u appointment.    Patient in no distress and overall condition improved here in the ED. Detailed discussions were had with the patient regarding current findings, and need for close f/u with PCP or on call doctor. The patient has been instructed to return immediately if the symptoms worsen in any way for re-evaluation. Patient verbalized understanding and is in agreement with current care plan. All questions answered prior to discharge.    Final Clinical Impression(s) / ED Diagnoses Final diagnoses:  Weakness  Blood pressure abnormally low at home    Rx / DC Orders ED Discharge Orders  None        Franne FortsGray, Markell Schrier P, DO 12/24/20 1704    Franne FortsGray, Racquelle Hyser P, DO 01/24/21 1304

## 2020-12-23 NOTE — ED Triage Notes (Addendum)
C/o low BP  with dizziness x 3 days, intentional weight loss of 10 lbs ,

## 2020-12-24 ENCOUNTER — Ambulatory Visit: Payer: Medicare PPO | Admitting: Physical Therapy

## 2020-12-24 ENCOUNTER — Encounter: Payer: Self-pay | Admitting: Physical Therapy

## 2020-12-24 DIAGNOSIS — M6281 Muscle weakness (generalized): Secondary | ICD-10-CM

## 2020-12-24 DIAGNOSIS — R262 Difficulty in walking, not elsewhere classified: Secondary | ICD-10-CM

## 2020-12-24 DIAGNOSIS — G8929 Other chronic pain: Secondary | ICD-10-CM

## 2020-12-24 DIAGNOSIS — M542 Cervicalgia: Secondary | ICD-10-CM | POA: Diagnosis not present

## 2020-12-24 DIAGNOSIS — M545 Low back pain, unspecified: Secondary | ICD-10-CM

## 2020-12-24 DIAGNOSIS — R252 Cramp and spasm: Secondary | ICD-10-CM

## 2020-12-24 LAB — TSH: TSH: 1.544 u[IU]/mL (ref 0.350–4.500)

## 2020-12-24 NOTE — Therapy (Signed)
[facility] [address]  [place], [place], [address] Phone: [phone]   Fax:  [phone]  Physical Therapy Treatment  Patient Details  Name: Deanna Compton MRN: [id number] Date of Birth: [birth year] Referring Provider (PT): Starling Manns   Encounter Date: 12/24/2020   PT End of Session - 12/24/20 1837     Visit Number 5    Number of Visits 12    Date for PT Re-Evaluation 01/14/21    Authorization Type [employer] MCR    Authorization Time Period 12/03/20-01/14/21    Authorization - Visit Number 12    Progress Note Due on Visit 10    PT Start Time 7510    PT Stop Time 1625    PT Time Calculation (min) 51 min    Activity Tolerance Patient tolerated treatment well;Patient limited by pain    Behavior During Therapy [facility] for tasks assessed/performed             Past Medical History:  Diagnosis Date   Arthritis    Glaucoma    Hyperlipemia    Hypertension     Past Surgical History:  Procedure Laterality Date   ABDOMINAL HYSTERECTOMY     APPENDECTOMY     EYE SURGERY     LEFT HEART CATH AND CORONARY ANGIOGRAPHY N/A 02/28/2018   Procedure: LEFT HEART CATH AND CORONARY ANGIOGRAPHY;  Surgeon: Lorretta Harp, MD;  Location: [facility];  Service: Cardiovascular;  Laterality: N/A;   ORIF ELBOW FRACTURE Left 12/12/2013   Procedure: OPEN REDUCTION INTERNAL FIXATION (ORIF) ELBOW/OLECRANON FRACTURE;  Surgeon: Roseanne Kaufman, MD;  Location: [facility];  Service: Orthopedics;  Laterality: Left;   TONSILLECTOMY      There were no vitals filed for this visit.   Subjective Assessment - 12/24/20 1539     Subjective Pt. reports went to ED last night for hypotension, said dr. think that since she has been following diet recommendations and lost weight, she is taking too much blood pressure medication, thinks was taken off the hydralazine.  She sat in emergency room all night and didn't leave until 8 AM this morning, so her back  is very sore.  She brought her home cervical traction unit in today as well, and new order to continue PT.    Pertinent History chronic neck, back pain.  Recent eye surgery.  R TKA.  HTN    Patient Stated Goals get rid of stiffness, more mobility    Currently in Pain? Yes    Pain Score 7     Pain Location Back    Pain Orientation Lower    Pain Descriptors / Indicators Aching    Pain Type Acute pain    Pain Score 4    Pain Location Neck    Pain Descriptors / Indicators Tightness    Pain Type Chronic pain                               [facility] Adult PT Treatment/Exercise - 12/24/20 0001       Exercises   Exercises Neck;Lumbar      Neck Exercises: Machines for Strengthening   UBE (Upper Arm Bike) L1 3 min foward, 3 min back      Lumbar Exercises: Stretches   Single Knee to Chest Stretch Right;Left;3 reps;20 seconds    Lower Trunk Rotation 5 reps;10 seconds    Pelvic Tilt 10 reps  Modalities   Modalities Moist Heat      Moist Heat Therapy   Number Minutes Moist Heat 10 Minutes    Moist Heat Location Cervical;Lumbar Spine      Manual Therapy   Manual Therapy Joint mobilization;Soft tissue mobilization;Myofascial release    Joint Mobilization PA mobs to cervical spine grade 2-3, NAGS into rotation, R UPA mobs lumbar spine grade 2-3    Soft tissue mobilization IASTM with foam roller to bil lumbar paraspinals, QL, glutes    Myofascial Release TPR R lumbar paraspinals and QL                       PT Short Term Goals - 12/24/20 1841       PT SHORT TERM GOAL #1   Title Pt. will be independent with initial HEP    Time 2    Period Weeks    Status Achieved    Target Date 12/17/20               PT Long Term Goals - 12/24/20 1841       PT LONG TERM GOAL #1   Title Pt. will be compliant with progressed HEP    Time 6    Period Weeks    Status On-going   met for current   Target Date 01/14/21      PT LONG TERM GOAL #2   Title Pt.  will demonstrate improved cervical ROM with 60 deg rotation bil for safety with driving.    Baseline see flowsheet    Time 6    Period Weeks    Status On-going    Target Date 01/14/21      PT LONG TERM GOAL #3   Title Pt. will report no more than 4/10 LBP with activities like cleaning.    Time 6    Period Weeks    Status On-going    Target Date 01/14/21      PT LONG TERM GOAL #4   Title Pt. will be able to walk without limitation from LBP.    Time 6    Period Weeks    Status On-going    Target Date 01/14/21                   Plan - 12/24/20 1838     Clinical Impression Statement Patient arrived with exacerbation of low back pain today after prolonged period of sitting in ED.  Interventions today focused on decreasing low back pain first with exercises/stretches followed by manual therapy to low back in prone, then placed in supine with MHP to back while addressed neck tightness, followed by MHP to cervical region as well.  Due to focus on back pain we did not trial cervical traction unit today and will next session.  Pt. reported significant decrease in low back pain following interventions today and would benefit from continued skilled therapy.    Personal Factors and Comorbidities Age;Comorbidity 3+;Time since onset of injury/illness/exacerbation    Comorbidities HTN, history RTA, chronic neck and back pain.    Examination-Activity Limitations Lift;Carry;Locomotion Level;Reach Overhead    Examination-Participation Restrictions Church;Cleaning;Community Activity    Stability/Clinical Decision Making Evolving/Moderate complexity    Rehab Potential Good    PT Frequency 2x / week    PT Duration 6 weeks    PT Treatment/Interventions ADLs/Self Care Home Management;Cryotherapy;Electrical Stimulation;Moist Heat;Traction;Ultrasound;Gait training;Stair training;Functional mobility training;Therapeutic activities;Therapeutic exercise;Neuromuscular re-education;Manual  techniques;Passive range of motion;Dry needling;Taping;Spinal Manipulations;Joint Manipulations  PT Next Visit Plan review HEP, core strengthening, manual therapy, cervical traction unit    PT Home Exercise Plan Access Code: [email or web address]    Consulted and Agree with Plan of Care Patient             Patient will benefit from skilled therapeutic intervention in order to improve the following deficits and impairments:  Decreased activity tolerance, Decreased endurance, Decreased range of motion, Decreased strength, Impaired UE functional use, Improper body mechanics, Pain, Decreased mobility, Difficulty walking, Increased muscle spasms, Postural dysfunction  Visit Diagnosis: Cervicalgia  Chronic midline low back pain without sciatica  Difficulty in walking, not elsewhere classified  Cramp and spasm  Muscle weakness (generalized)     Problem List Patient Active Problem List   Diagnosis Date Noted   Essential hypertension 03/01/2018   Hyperlipidemia 03/01/2018   Chest pain 03/01/2018   Unstable angina ([place]) 02/27/2018   Fracture dislocation of left elbow joint 12/13/2013    Rennie Natter, PT, DPT 12/24/2020, 6:44 PM  [facility] [address]  [place], [place], [address] Phone: [phone]   Fax:  [phone]  Name: Deanna Compton MRN: [id number] Date of Birth: 02-10-1946

## 2020-12-26 ENCOUNTER — Ambulatory Visit: Payer: Medicare PPO | Admitting: Physical Therapy

## 2020-12-26 ENCOUNTER — Encounter: Payer: Self-pay | Admitting: Physical Therapy

## 2020-12-26 ENCOUNTER — Other Ambulatory Visit: Payer: Self-pay

## 2020-12-26 DIAGNOSIS — M542 Cervicalgia: Secondary | ICD-10-CM | POA: Diagnosis not present

## 2020-12-26 DIAGNOSIS — R262 Difficulty in walking, not elsewhere classified: Secondary | ICD-10-CM

## 2020-12-26 DIAGNOSIS — M6281 Muscle weakness (generalized): Secondary | ICD-10-CM

## 2020-12-26 DIAGNOSIS — R252 Cramp and spasm: Secondary | ICD-10-CM

## 2020-12-26 DIAGNOSIS — G8929 Other chronic pain: Secondary | ICD-10-CM

## 2020-12-26 NOTE — Therapy (Signed)
[facility] [address]  [place], [place], [address] Phone: [phone]   Fax:  [phone]  Physical Therapy Treatment  Patient Details  Name: Deanna Compton MRN: [id number] Date of Birth: [birth year] Referring Provider (PT): Starling Manns   Encounter Date: 12/26/2020   PT End of Session - 12/26/20 1454     Visit Number 6    Number of Visits 12    Date for PT Re-Evaluation 01/14/21    Authorization Type [employer] MCR    Authorization Time Period 12/03/20-01/14/21    Authorization - Visit Number 12    Progress Note Due on Visit 10    PT Start Time 3664    PT Stop Time 4034    PT Time Calculation (min) 43 min    Activity Tolerance Patient tolerated treatment well;Patient limited by pain    Behavior During Therapy [facility] for tasks assessed/performed             Past Medical History:  Diagnosis Date   Arthritis    Glaucoma    Hyperlipemia    Hypertension     Past Surgical History:  Procedure Laterality Date   ABDOMINAL HYSTERECTOMY     APPENDECTOMY     EYE SURGERY     LEFT HEART CATH AND CORONARY ANGIOGRAPHY N/A 02/28/2018   Procedure: LEFT HEART CATH AND CORONARY ANGIOGRAPHY;  Surgeon: Lorretta Harp, MD;  Location: [facility];  Service: Cardiovascular;  Laterality: N/A;   ORIF ELBOW FRACTURE Left 12/12/2013   Procedure: OPEN REDUCTION INTERNAL FIXATION (ORIF) ELBOW/OLECRANON FRACTURE;  Surgeon: Roseanne Kaufman, MD;  Location: [facility];  Service: Orthopedics;  Laterality: Left;   TONSILLECTOMY      There were no vitals filed for this visit.   Subjective Assessment - 12/26/20 1449     Subjective Patient reports back is feeling much better, although after appt. was very sore, pain went away yesterday and slept great last night.  Today mostly feels tight.    Pertinent History chronic neck, back pain.  Recent eye surgery.  R TKA.  HTN    Patient Stated Goals get rid of stiffness, more mobility    Currently  in Pain? 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 Cohen    Hand Dominance Right      AROM   AROM Assessment Site Cervical    Cervical Flexion 45    Cervical Extension 40    Cervical - Right Rotation 65    Cervical - Left Rotation 65                           [facility] Adult PT Treatment/Exercise - 12/26/20 0001       Self-Care   Self-Care Other Self-Care Comments    Other Self-Care Comments  educated on how to use her home traction unit      Exercises   Exercises  Neck;Lumbar      Neck Exercises: Machines for Strengthening   UBE (Upper Arm Bike) L1 3 min foward, 3 min back      Neck Exercises: Theraband   Scapula Retraction 20 reps;Red    Scapula Retraction Limitations demo and VC    Shoulder Extension 20 reps;Red    Shoulder Extension Limitations demo and TC    Rows 20 reps;Red    Rows Limitations demo and TC    Shoulder External Rotation 20 reps;Red      Manual Therapy   Manual Therapy Joint mobilization;Soft tissue mobilization;Manual Traction    Joint Mobilization PA mobs to cervical spine grade 2-3, NAGS into rotation    Soft tissue mobilization STM to cervical paraspinals.    Manual Traction used home traction unit x 5 min with 18# pull, educated throughout on use                     PT Education - 12/26/20 1535     Education Details hep update, Access Code: [email or web address], Red TB issued    Person(s) Educated Patient    Methods Explanation;Demonstration;Verbal cues;Handout    Comprehension Verbalized understanding;Returned demonstration              PT Short Term Goals - 12/24/20 1841        PT SHORT TERM GOAL #1   Title Pt. will be independent with initial HEP    Time 2    Period Weeks    Status Achieved    Target Date 12/17/20               PT Long Term Goals - 12/26/20 1454       PT LONG TERM GOAL #1   Title Pt. will be compliant with progressed HEP    Time 6    Period Weeks    Status On-going   met for current     PT LONG TERM GOAL #2   Title Pt. will demonstrate improved cervical ROM with 60 deg rotation bil for safety with driving.    Baseline see flowsheet    Time 6    Period Weeks    Status Achieved   12/26/20- 65 deg cervical rotation bil.     PT LONG TERM GOAL #3   Title Pt. will report no more than 4/10 LBP with activities like cleaning.    Time 6    Period Weeks    Status On-going      PT LONG TERM GOAL #4   Title Pt. will be able to walk without limitation from LBP.    Time 6    Period Weeks    Status On-going   recovering from incident of severe back pain.                  Plan - 12/26/20 1819     Clinical Impression Statement Patient reported more neck stiffness today, acute back pain has resolved.  Progressed exercises for postural strengthening with Red theraband, which she tolerated well.  Set up her home traction unit and educated on it, trialed x 5 min after which she did report decreased tightness in her neck.  After manual therapy she demonstrated improved cervical ROM, especially with L rotation, able to rotate 65 deg without difficulty, meeting LTG#2.  She would benefit from continued skilled therapy.    Personal Factors and Comorbidities Age;Comorbidity 3+;Time since onset of injury/illness/exacerbation    Comorbidities HTN, history RTA, chronic neck and  back pain.    Examination-Activity Limitations Lift;Carry;Locomotion Level;Reach Overhead    Examination-Participation Restrictions Church;Cleaning;Community Activity    Stability/Clinical Decision Making Evolving/Moderate complexity    Rehab Potential Good     PT Frequency 2x / week    PT Duration 6 weeks    PT Treatment/Interventions ADLs/Self Care Home Management;Cryotherapy;Electrical Stimulation;Moist Heat;Traction;Ultrasound;Gait training;Stair training;Functional mobility training;Therapeutic activities;Therapeutic exercise;Neuromuscular re-education;Manual techniques;Passive range of motion;Dry needling;Taping;Spinal Manipulations;Joint Manipulations    PT Next Visit Plan review HEP, core strengthening, manual therapy, cervical traction unit    PT Home Exercise Plan Access Code: [email or web address]    Consulted and Agree with Plan of Care Patient             Patient will benefit from skilled therapeutic intervention in order to improve the following deficits and impairments:  Decreased activity tolerance, Decreased endurance, Decreased range of motion, Decreased strength, Impaired UE functional use, Improper body mechanics, Pain, Decreased mobility, Difficulty walking, Increased muscle spasms, Postural dysfunction  Visit Diagnosis: Cervicalgia  Chronic midline low back pain without sciatica  Difficulty in walking, not elsewhere classified  Cramp and spasm  Muscle weakness (generalized)     Problem List Patient Active Problem List   Diagnosis Date Noted   Essential hypertension 03/01/2018   Hyperlipidemia 03/01/2018   Chest pain 03/01/2018   Unstable angina ([place]) 02/27/2018   Fracture dislocation of left elbow joint 12/13/2013    Rennie Natter, PT DPT 12/26/2020, 6:23 PM  [facility] [address]  [place], [place], [address] Phone: [phone]   Fax:  [phone]  Name: Deanna Compton MRN: [id number] Date of Birth: 1946/02/13

## 2020-12-26 NOTE — Patient Instructions (Signed)
Access Code: [email or web address] URL: [email or web address] Date: 12/26/2020 Prepared by: Harrie Foreman  Exercises Shoulder extension with resistance - Neutral - 1 x daily - 7 x weekly - 2 sets - 10 reps Standing Shoulder External Rotation with Resistance - 1 x daily - 7 x weekly - 2 sets - 10 reps Scapular Retraction with Resistance - 1 x daily - 7 x weekly - 2 sets - 10 reps

## 2020-12-31 ENCOUNTER — Ambulatory Visit: Payer: Medicare PPO | Admitting: Physical Therapy

## 2020-12-31 ENCOUNTER — Other Ambulatory Visit: Payer: Self-pay

## 2020-12-31 ENCOUNTER — Encounter: Payer: Self-pay | Admitting: Physical Therapy

## 2020-12-31 DIAGNOSIS — M542 Cervicalgia: Secondary | ICD-10-CM | POA: Diagnosis not present

## 2020-12-31 DIAGNOSIS — R262 Difficulty in walking, not elsewhere classified: Secondary | ICD-10-CM

## 2020-12-31 DIAGNOSIS — M6281 Muscle weakness (generalized): Secondary | ICD-10-CM

## 2020-12-31 DIAGNOSIS — R252 Cramp and spasm: Secondary | ICD-10-CM

## 2020-12-31 DIAGNOSIS — G8929 Other chronic pain: Secondary | ICD-10-CM

## 2020-12-31 NOTE — Therapy (Signed)
[facility] [address]  [place], [place], [address] Phone: [phone]   Fax:  [phone]  Physical Therapy Treatment  Patient Details  Name: Deanna Compton MRN: [id number] Date of Birth: 05/08/1945 Referring Provider (PT): Starling Manns   Encounter Date: 12/31/2020   PT End of Session - 12/31/20 1628     Visit Number 7    Number of Visits 12    Date for PT Re-Evaluation 01/14/21    Authorization Type [employer] MCR    Authorization Time Period 12/03/20-01/14/21    Authorization - Visit Number 12    Progress Note Due on Visit 10    PT Start Time 1620    PT Stop Time 1703    PT Time Calculation (min) 43 min    Activity Tolerance Patient tolerated treatment well;Patient limited by pain    Behavior During Therapy [facility] for tasks assessed/performed             Past Medical History:  Diagnosis Date   Arthritis    Glaucoma    Hyperlipemia    Hypertension     Past Surgical History:  Procedure Laterality Date   ABDOMINAL HYSTERECTOMY     APPENDECTOMY     EYE SURGERY     LEFT HEART CATH AND CORONARY ANGIOGRAPHY N/A 02/28/2018   Procedure: LEFT HEART CATH AND CORONARY ANGIOGRAPHY;  Surgeon: Lorretta Harp, MD;  Location: [facility];  Service: Cardiovascular;  Laterality: N/A;   ORIF ELBOW FRACTURE Left 12/12/2013   Procedure: OPEN REDUCTION INTERNAL FIXATION (ORIF) ELBOW/OLECRANON FRACTURE;  Surgeon: Roseanne Kaufman, MD;  Location: [facility];  Service: Orthopedics;  Laterality: Left;   TONSILLECTOMY      There were no vitals filed for this visit.   Subjective Assessment - 12/31/20 1623     Subjective Patient reports she is doing well.  BP more stable, 120/70 this morning.  Neck is stiff.  Compliant with HEP, had done most days.  Walked on treadmill x 1 mile today.    Pertinent History chronic neck, back pain.  Recent eye surgery.  R TKA.  HTN    Patient Stated Goals get rid of stiffness, more mobility     Currently in Pain? Yes    Pain Score 4     Pain Location Neck    Pain Descriptors / Indicators Tightness    Pain Score 3    Pain Location Back    Pain Orientation Lower    Pain Descriptors / Indicators Aching    Pain Radiating Towards no longer radiates.                               [facility] Adult PT Treatment/Exercise - 12/31/20 0001       Self-Care   Self-Care Other Self-Care Comments    Other Self-Care Comments  reviewing home traction unit      Exercises   Exercises Neck;Lumbar      Neck Exercises: Machines for Strengthening   UBE (Upper Arm Bike) L1 3 min foward, 3 min back      Neck Exercises: Theraband   Scapula Retraction 20 reps;Red    Scapula Retraction Limitations VC    Shoulder Extension 20 reps;Red    Shoulder Extension Limitations VC    Rows 20 reps;Red    Rows Limitations VC    Shoulder External Rotation 20 reps;Red    Shoulder External Rotation  Limitations VC      Neck Exercises: Standing   Wall Push Ups 20 reps    Wall Push Ups Limitations cues for form needed.    Other Standing Exercises shoulder rolls retro between exercises to decrease tightness.      Manual Therapy   Manual Therapy Soft tissue mobilization    Soft tissue mobilization STM/TPR to bil UT and levator scapulae    Manual Traction used home traction unit x 5 min with 20# pull, educated throughout on use                       PT Short Term Goals - 12/24/20 1841       PT SHORT TERM GOAL #1   Title Pt. will be independent with initial HEP    Time 2    Period Weeks    Status Achieved    Target Date 12/17/20               PT Long Term Goals - 12/31/20 1626       PT LONG TERM GOAL #1   Title Pt. will be compliant with progressed HEP    Time 6    Period Weeks    Status On-going   12/31/20- met for current     PT LONG TERM GOAL #2   Title Pt. will demonstrate improved cervical ROM with 60 deg rotation bil for safety with driving.     Baseline see flowsheet    Time 6    Period Weeks    Status Achieved   12/26/20- 65 deg cervical rotation bil.     PT LONG TERM GOAL #3   Title Pt. will report no more than 4/10 LBP with activities like cleaning.    Time 6    Period Weeks    Status On-going      PT LONG TERM GOAL #4   Title Pt. will be able to walk without limitation from LBP.    Time 6    Period Weeks    Status On-going   12/31/20- able to walk 1 mile on treadmill now                  Plan - 12/31/20 1719     Clinical Impression Statement Patient reports primarily neck stiffness again today, back pain and radicular symptoms have resolved and up to walking 1 mile on treadmill.  Reviewed exercises for periscapular strengthening, still needing tactile cues to avoid compensating with upper trap/levator.  Also reviewed use of self-traction machine, and problem solved how to use comfortably at home, with recommendations to buy thick exercise mat to use on floor.  She reported decreased stiffness after 5 min of traction.  Manual therapy to bil UT and levators as still very tight.  Pt. would benefit from continued skilled therapy.    Personal Factors and Comorbidities Age;Comorbidity 3+;Time since onset of injury/illness/exacerbation    Comorbidities HTN, history RTA, chronic neck and back pain.    Examination-Activity Limitations Lift;Carry;Locomotion Level;Reach Overhead    Examination-Participation Restrictions Church;Cleaning;Community Activity    Stability/Clinical Decision Making Evolving/Moderate complexity    Rehab Potential Good    PT Frequency 2x / week    PT Duration 6 weeks    PT Treatment/Interventions ADLs/Self Care Home Management;Cryotherapy;Electrical Stimulation;Moist Heat;Traction;Ultrasound;Gait training;Stair training;Functional mobility training;Therapeutic activities;Therapeutic exercise;Neuromuscular re-education;Manual techniques;Passive range of motion;Dry needling;Taping;Spinal  Manipulations;Joint Manipulations    PT Next Visit Plan review HEP, core strengthening, manual therapy, cervical traction  unit    PT Home Exercise Plan Access Code: [email or web address]    Consulted and Agree with Plan of Care Patient             Patient will benefit from skilled therapeutic intervention in order to improve the following deficits and impairments:  Decreased activity tolerance, Decreased endurance, Decreased range of motion, Decreased strength, Impaired UE functional use, Improper body mechanics, Pain, Decreased mobility, Difficulty walking, Increased muscle spasms, Postural dysfunction  Visit Diagnosis: Cervicalgia  Chronic midline low back pain without sciatica  Difficulty in walking, not elsewhere classified  Cramp and spasm  Muscle weakness (generalized)     Problem List Patient Active Problem List   Diagnosis Date Noted   Essential hypertension 03/01/2018   Hyperlipidemia 03/01/2018   Chest pain 03/01/2018   Unstable angina ([place]) 02/27/2018   Fracture dislocation of left elbow joint 12/13/2013    Rennie Natter, PT, DPT 12/31/2020, 5:25 PM  [facility] [address]  [place], [place], [address] Phone: [phone]   Fax:  [phone]  Name: Deanna Compton MRN: [id number] Date of Birth: 05-01-1945

## 2021-01-02 ENCOUNTER — Encounter: Payer: Self-pay | Admitting: Physical Therapy

## 2021-01-02 ENCOUNTER — Ambulatory Visit: Payer: Medicare PPO | Admitting: Physical Therapy

## 2021-01-02 ENCOUNTER — Other Ambulatory Visit: Payer: Self-pay

## 2021-01-02 DIAGNOSIS — M545 Low back pain, unspecified: Secondary | ICD-10-CM

## 2021-01-02 DIAGNOSIS — R252 Cramp and spasm: Secondary | ICD-10-CM

## 2021-01-02 DIAGNOSIS — G8929 Other chronic pain: Secondary | ICD-10-CM

## 2021-01-02 DIAGNOSIS — M542 Cervicalgia: Secondary | ICD-10-CM

## 2021-01-02 DIAGNOSIS — M6281 Muscle weakness (generalized): Secondary | ICD-10-CM

## 2021-01-02 DIAGNOSIS — R262 Difficulty in walking, not elsewhere classified: Secondary | ICD-10-CM

## 2021-01-02 NOTE — Therapy (Signed)
[facility] [address]  [place], [place], [address] Phone: [phone]   Fax:  [phone]  Physical Therapy Treatment  Patient Details  Name: Deanna Compton MRN: [id number] Date of Birth: Oct 25, 1945 Referring Provider (PT): Starling Manns   Encounter Date: 01/02/2021   PT End of Session - 01/02/21 1538     Visit Number 8    Number of Visits 12    Date for PT Re-Evaluation 01/14/21    Authorization Type [employer] MCR    Authorization Time Period 12/03/20-01/14/21    Authorization - Visit Number 12    Progress Note Due on Visit 10    PT Start Time 7425    PT Stop Time 9563    PT Time Calculation (min) 44 min    Activity Tolerance Patient tolerated treatment well;Patient limited by pain    Behavior During Therapy [facility] for tasks assessed/performed             Past Medical History:  Diagnosis Date   Arthritis    Glaucoma    Hyperlipemia    Hypertension     Past Surgical History:  Procedure Laterality Date   ABDOMINAL HYSTERECTOMY     APPENDECTOMY     EYE SURGERY     LEFT HEART CATH AND CORONARY ANGIOGRAPHY N/A 02/28/2018   Procedure: LEFT HEART CATH AND CORONARY ANGIOGRAPHY;  Surgeon: Lorretta Harp, MD;  Location: [place] [facility];  Service: Cardiovascular;  Laterality: N/A;   ORIF ELBOW FRACTURE Left 12/12/2013   Procedure: OPEN REDUCTION INTERNAL FIXATION (ORIF) ELBOW/OLECRANON FRACTURE;  Surgeon: Roseanne Kaufman, MD;  Location: [place];  Service: Orthopedics;  Laterality: Left;   TONSILLECTOMY      There were no vitals filed for this visit.   Subjective Assessment - 01/02/21 1535     Subjective Pt. reports neck still stiff but not as stiff as when came in last session.  Back still good.    Pertinent History chronic neck, back pain.  Recent eye surgery.  R TKA.  HTN    Patient Stated Goals get rid of stiffness, more mobility    Currently in Pain? Yes    Pain Score 3     Pain Location Neck    Pain  Descriptors / Indicators Tightness    Pain Type Chronic pain                               [facility] Adult PT Treatment/Exercise - 01/02/21 0001       Self-Care   Self-Care ADL's;Other Self-Care Comments    ADL's modifications with demo for sweeping, vacuuming, making beds, cleaning bathtub    Other Self-Care Comments  reviewing home traction unit      Neck Exercises: Machines for Strengthening   UBE (Upper Arm Bike) L1 3 min foward, 3 min back      Manual Therapy   Manual Therapy Soft tissue mobilization;Joint mobilization    Joint Mobilization PA mobs to cervical spine grade 2-3, NAGS into rotation    Soft tissue mobilization STM/TPR to bil UT and levator scapulae    Manual Traction used home traction unit x 5 min with 20# pull, educated throughout on use                       PT Short Term Goals - 12/24/20 1841       PT SHORT  TERM GOAL #1   Title Pt. will be independent with initial HEP    Time 2    Period Weeks    Status Achieved    Target Date 12/17/20               PT Long Term Goals - 01/02/21 1539       PT LONG TERM GOAL #1   Title Pt. will be compliant with progressed HEP    Time 6    Period Weeks    Status On-going   12/31/20- met for current     PT LONG TERM GOAL #2   Title Pt. will demonstrate improved cervical ROM with 60 deg rotation bil for safety with driving.    Baseline see flowsheet    Time 6    Period Weeks    Status Achieved   12/26/20- 65 deg cervical rotation bil.     PT LONG TERM GOAL #3   Title Pt. will report no more than 4/10 LBP with activities like cleaning.    Time 6    Period Weeks    Status On-going   01/02/21- reviewed modifications to ADLs to prevent LBP     PT LONG TERM GOAL #4   Title Pt. will be able to walk without limitation from LBP.    Time 6    Period Weeks    Status Achieved   01/03/19- no limitation from back pain with walking, able to walk 1 mile with treadmill                   Plan - 01/02/21 1810     Clinical Impression Statement Focus of today's interventions was on education on performing ADLs with less report of low back pain and reviewing use of self traction machine to improve confidence and self-efficacy with managing symptoms at home.  She again reported decreased tightness in neck following 5 min traction, and took unit home today to start using.  Reported further decrease in pain/tightness following manual therapy to neck and declined modalities.  Pt. would benefit from continued skilled therapy.    Personal Factors and Comorbidities Age;Comorbidity 3+;Time since onset of injury/illness/exacerbation    Comorbidities HTN, history RTA, chronic neck and back pain.    Examination-Activity Limitations Lift;Carry;Locomotion Level;Reach Overhead    Examination-Participation Restrictions Church;Cleaning;Community Activity    Stability/Clinical Decision Making Evolving/Moderate complexity    Rehab Potential Good    PT Frequency 2x / week    PT Duration 6 weeks    PT Treatment/Interventions ADLs/Self Care Home Management;Cryotherapy;Electrical Stimulation;Moist Heat;Traction;Ultrasound;Gait training;Stair training;Functional mobility training;Therapeutic activities;Therapeutic exercise;Neuromuscular re-education;Manual techniques;Passive range of motion;Dry needling;Taping;Spinal Manipulations;Joint Manipulations    PT Next Visit Plan review HEP, core strengthening, manual therapy, cervical traction unit    PT Home Exercise Plan Access Code: [email or web address]    Consulted and Agree with Plan of Care Patient             Patient will benefit from skilled therapeutic intervention in order to improve the following deficits and impairments:  Decreased activity tolerance, Decreased endurance, Decreased range of motion, Decreased strength, Impaired UE functional use, Improper body mechanics, Pain, Decreased mobility, Difficulty walking, Increased muscle spasms,  Postural dysfunction  Visit Diagnosis: Cervicalgia  Chronic midline low back pain without sciatica  Difficulty in walking, not elsewhere classified  Cramp and spasm  Muscle weakness (generalized)     Problem List Patient Active Problem List   Diagnosis Date Noted   Essential hypertension 03/01/2018   Hyperlipidemia  03/01/2018   Chest pain 03/01/2018   Unstable angina ([place]) 02/27/2018   Fracture dislocation of left elbow joint 12/13/2013    Rennie Natter, PT, DPT 01/02/2021, 6:14 PM  [facility] [address]  [place], [place], [address] Phone: [phone]   Fax:  [phone]  Name: Cariah Compton MRN: [id number] Date of Birth: May 04, 1945

## 2021-01-06 ENCOUNTER — Encounter: Payer: Self-pay | Admitting: Physical Therapy

## 2021-01-06 ENCOUNTER — Ambulatory Visit: Payer: Medicare PPO | Admitting: Physical Therapy

## 2021-01-06 ENCOUNTER — Other Ambulatory Visit: Payer: Self-pay

## 2021-01-06 DIAGNOSIS — G8929 Other chronic pain: Secondary | ICD-10-CM

## 2021-01-06 DIAGNOSIS — M542 Cervicalgia: Secondary | ICD-10-CM

## 2021-01-06 DIAGNOSIS — M6281 Muscle weakness (generalized): Secondary | ICD-10-CM

## 2021-01-06 DIAGNOSIS — M545 Low back pain, unspecified: Secondary | ICD-10-CM

## 2021-01-06 DIAGNOSIS — R252 Cramp and spasm: Secondary | ICD-10-CM

## 2021-01-06 DIAGNOSIS — R262 Difficulty in walking, not elsewhere classified: Secondary | ICD-10-CM

## 2021-01-06 NOTE — Therapy (Signed)
Deanna Compton.  [place], [place], [place] Phone: [phone]   Fax:  [phone]  Physical Therapy Treatment  Patient Details  Name: Deanna Compton MRN: [id number] Date of Birth: 16-Mar-1946 Referring Provider (PT): Starling Manns   Encounter Date: 01/06/2021   PT End of Session - 01/06/21 1617     Visit Number 9    Number of Visits 12    Date for PT Re-Evaluation 01/14/21    Authorization Type [employer] MCR    Authorization Time Period 12/03/20-01/14/21    Authorization - Visit Number 12    Progress Note Due on Visit 10    PT Start Time 0332    PT Stop Time 0425    PT Time Calculation (min) 53 min    Activity Tolerance Patient tolerated treatment well;Patient limited by pain    Behavior During Therapy [facility] for tasks assessed/performed             Past Medical History:  Diagnosis Date   Arthritis    Glaucoma    Hyperlipemia    Hypertension     Past Surgical History:  Procedure Laterality Date   ABDOMINAL HYSTERECTOMY     APPENDECTOMY     EYE SURGERY     LEFT HEART CATH AND CORONARY ANGIOGRAPHY N/A 02/28/2018   Procedure: LEFT HEART CATH AND CORONARY ANGIOGRAPHY;  Surgeon: Lorretta Harp, MD;  Location: [place] [facility];  Service: Cardiovascular;  Laterality: N/A;   ORIF ELBOW FRACTURE Left 12/12/2013   Procedure: OPEN REDUCTION INTERNAL FIXATION (ORIF) ELBOW/OLECRANON FRACTURE;  Surgeon: Roseanne Kaufman, MD;  Location: [place];  Service: Orthopedics;  Laterality: Left;   TONSILLECTOMY      There were no vitals filed for this visit.   Subjective Assessment - 01/06/21 1535     Subjective Pt reports she had a good weekend at the beach.  She had a little pain last night in neck, but attributes it to packing and cleaning and followed [place].    Pertinent History chronic neck, back pain.  Recent eye surgery.  R TKA.  HTN    Patient Stated Goals get rid of stiffness, more  mobility    Currently in Pain? Yes    Pain Score 4     Pain Location Neck                               [facility] Adult PT Treatment/Exercise - 01/06/21 0001       Exercises   Exercises Neck      Neck Exercises: Machines for Strengthening   UBE (Upper Arm Bike) L1 3 min foward, 3 min back      Neck Exercises: Theraband   Scapula Retraction 20 reps;Red    Shoulder Extension 20 reps;Red    Rows 20 reps;Red    Shoulder External Rotation 20 reps;Red    Shoulder External Rotation Limitations additional set with oscillations    Other Theraband Exercises diagonals both directions x 10      Neck Exercises: Standing   Other Standing Exercises plank with thoracic rotation at counter x 15 bil      Modalities   Modalities Moist Heat      Moist Heat Therapy   Number Minutes Moist Heat 10 Minutes    Moist Heat Location Cervical      Manual Therapy   Manual  Therapy Soft tissue mobilization;Joint mobilization    Manual therapy comments cervical region in supine.    Joint Mobilization PA mobs to cervical spine grade 2-3, NAGS into rotation    Soft tissue mobilization STM/TPR to bil UT and levator scapulae                       PT Short Term Goals - 12/24/20 1841       PT SHORT TERM GOAL #1   Title Pt. will be independent with initial HEP    Time 2    Period Weeks    Status Achieved    Target Date 12/17/20               PT Long Term Goals - 01/02/21 1539       PT LONG TERM GOAL #1   Title Pt. will be compliant with progressed HEP    Time 6    Period Weeks    Status On-going   12/31/20- met for current     PT LONG TERM GOAL #2   Title Pt. will demonstrate improved cervical ROM with 60 deg rotation bil for safety with driving.    Baseline see flowsheet    Time 6    Period Weeks    Status Achieved   12/26/20- 65 deg cervical rotation bil.     PT LONG TERM GOAL #3   Title Pt. will report no more than 4/10 LBP with activities like  cleaning.    Time 6    Period Weeks    Status On-going   01/02/21- reviewed modifications to ADLs to prevent LBP     PT LONG TERM GOAL #4   Title Pt. will be able to walk without limitation from LBP.    Time 6    Period Weeks    Status Achieved   01/03/19- no limitation from back pain with walking, able to walk 1 mile with treadmill                  Plan - 01/06/21 1635     Clinical Impression Statement Deanna Compton is doing well overall, a little more pain after traveling home yesterday.  Reviewed and progressed exercises today, able to do without complaint of discomfort or pain.  Noted increased muscle tension in neck, decreased after manual therapy and MHP.  She would benefit from continued skilled therapy.    Personal Factors and Comorbidities Age;Comorbidity 3+;Time since onset of injury/illness/exacerbation    Comorbidities HTN, history RTA, chronic neck and back pain.    Examination-Activity Limitations Lift;Carry;Locomotion Level;Reach Overhead    Examination-Participation Restrictions Church;Cleaning;Community Activity    Stability/Clinical Decision Making Evolving/Moderate complexity    Rehab Potential Good    PT Frequency 2x / week    PT Duration 6 weeks    PT Treatment/Interventions ADLs/Self Care Home Management;Cryotherapy;Electrical Stimulation;Moist Heat;Traction;Ultrasound;Gait training;Stair training;Functional mobility training;Therapeutic activities;Therapeutic exercise;Neuromuscular re-education;Manual techniques;Passive range of motion;Dry needling;Taping;Spinal Manipulations;Joint Manipulations    PT Next Visit Plan review HEP, core strengthening, manual therapy, cervical traction unit    PT Home Exercise Plan Access Code: [email or web address]    Consulted and Agree with Plan of Care Patient             Patient will benefit from skilled therapeutic intervention in order to improve the following deficits and impairments:  Decreased activity tolerance, Decreased endurance,  Decreased range of motion, Decreased strength, Impaired UE functional use, Improper body mechanics, Pain, Decreased mobility, Difficulty walking, Increased muscle  spasms, Postural dysfunction  Visit Diagnosis: Cervicalgia  Chronic midline low back pain without sciatica  Difficulty in walking, not elsewhere classified  Cramp and spasm  Muscle weakness (generalized)     Problem List Patient Active Problem List   Diagnosis Date Noted   Essential hypertension 03/01/2018   Hyperlipidemia 03/01/2018   Chest pain 03/01/2018   Unstable angina ([place]) 02/27/2018   Fracture dislocation of left elbow joint 12/13/2013    Deanna Compton, PT, DPT 01/06/2021, 5:05 PM  [facility] [address]  [place], [place], [address] Phone: [phone]   Fax:  [phone]  Name: Deanna Compton MRN: [id number] Date of Birth: 12-03-1945

## 2021-01-09 ENCOUNTER — Encounter: Payer: Self-pay | Admitting: Physical Therapy

## 2021-01-09 ENCOUNTER — Ambulatory Visit: Payer: Medicare PPO | Admitting: Physical Therapy

## 2021-01-09 ENCOUNTER — Other Ambulatory Visit: Payer: Self-pay

## 2021-01-09 DIAGNOSIS — M6281 Muscle weakness (generalized): Secondary | ICD-10-CM

## 2021-01-09 DIAGNOSIS — R262 Difficulty in walking, not elsewhere classified: Secondary | ICD-10-CM

## 2021-01-09 DIAGNOSIS — G8929 Other chronic pain: Secondary | ICD-10-CM

## 2021-01-09 DIAGNOSIS — M542 Cervicalgia: Secondary | ICD-10-CM

## 2021-01-09 DIAGNOSIS — M545 Low back pain, unspecified: Secondary | ICD-10-CM

## 2021-01-09 DIAGNOSIS — R252 Cramp and spasm: Secondary | ICD-10-CM

## 2021-01-09 NOTE — Therapy (Signed)
[facility] [address]  [place], [place], [address] Phone: [phone]   Fax:  [phone]  Physical Therapy Treatment Progress Note Reporting Period 12/03/20 to 01/09/21  See note below for Objective Data and Assessment of Progress/Goals.     Patient Details  Name: Deanna Compton MRN: [id number] Date of Birth: [birth year] Referring Provider (PT): Starling Manns   Encounter Date: 01/09/2021   PT End of Session - 01/09/21 1539     Visit Number 10    Number of Visits 12    Date for PT Re-Evaluation 01/14/21    Authorization Type [employer] MCR    Authorization Time Period 12/03/20-01/14/21    Authorization - Visit Number 12    Progress Note Due on Visit 10    PT Start Time 3007    PT Stop Time 1616    PT Time Calculation (min) 42 min    Activity Tolerance Patient tolerated treatment well;Patient limited by pain    Behavior During Therapy [facility] for tasks assessed/performed             Past Medical History:  Diagnosis Date   Arthritis    Glaucoma    Hyperlipemia    Hypertension     Past Surgical History:  Procedure Laterality Date   ABDOMINAL HYSTERECTOMY     APPENDECTOMY     EYE SURGERY     LEFT HEART CATH AND CORONARY ANGIOGRAPHY N/A 02/28/2018   Procedure: LEFT HEART CATH AND CORONARY ANGIOGRAPHY;  Surgeon: Lorretta Harp, MD;  Location: [place] [facility];  Service: Cardiovascular;  Laterality: N/A;   ORIF ELBOW FRACTURE Left 12/12/2013   Procedure: OPEN REDUCTION INTERNAL FIXATION (ORIF) ELBOW/OLECRANON FRACTURE;  Surgeon: Roseanne Kaufman, MD;  Location: [place];  Service: Orthopedics;  Laterality: Left;   TONSILLECTOMY      There were no vitals filed for this visit.   Subjective Assessment - 01/09/21 1538     Subjective Patient reports neck continues to be more stiff than painful.    Pertinent History chronic neck, back pain.  Recent eye surgery.  R TKA.  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 Cohen    Hand Dominance Right      Observation/Other Assessments   Focus on Therapeutic Outcomes (FOTO)  54      AROM   AROM Assessment Site Cervical    Cervical Flexion 50    Cervical Extension 50    Cervical - Right Rotation 68    Cervical - Left Rotation 65                           [facility] Adult PT Treatment/Exercise - 01/09/21 0001       Exercises   Exercises Neck      Neck Exercises: Machines for Strengthening   UBE (Upper Arm Bike) L1 3 min foward, 3 min back      Manual Therapy   Manual Therapy Soft tissue mobilization;Joint mobilization  Manual therapy comments cervical region in supine.    Joint Mobilization PA mobs to cervical spine grade 2-3, NAGS into rotation    Soft tissue mobilization STM/TPR to bil UT and levator scapulae                       PT Short Term Goals - 12/24/20 1841       PT SHORT TERM GOAL #1   Title Pt. will be independent with initial HEP    Time 2    Period Weeks    Status Achieved    Target Date 12/17/20               PT Long Term Goals - 01/09/21 1540       PT LONG TERM GOAL #1   Title Pt. will be compliant with progressed HEP    Time 6    Period Weeks    Status Achieved   01/09/21- met for current     PT LONG TERM GOAL #2   Title Pt. will demonstrate improved cervical ROM with 60 deg rotation bil for safety with driving.    Baseline see flowsheet    Time 6    Period Weeks    Status Achieved   12/26/20- 65 deg cervical rotation bil.     PT LONG TERM GOAL #3   Title Pt. will report no more than 4/10 LBP with activities like cleaning.    Time 6    Period Weeks    Status On-going    01/02/21- reviewed modifications to ADLs to prevent LBP     PT LONG TERM GOAL #4   Title Pt. will be able to walk without limitation from LBP.    Time 6    Period Weeks    Status Achieved   01/03/19- no limitation from back pain with walking, able to walk 1 mile with treadmill                  Plan - 01/09/21 1623     Clinical Impression Statement Jacqlyn Larsen is making good progress, and has met 3/4 LTG.  She demonstrates signficant improvement in cervical ROM and reports mostly tightness rather than pain.  Her back pain has resolved and she is able to walk 1 mile/day.   Her FOTO score has improved slightly.  Discussed today after Monday's visit putting her on 30 day hold to see if she is able to maintain progress on her own.  Today focused interventions on manual therapy to decrease cervical tightness.   Declined modalities at end.    Personal Factors and Comorbidities Age;Comorbidity 3+;Time since onset of injury/illness/exacerbation    Comorbidities HTN, history RTA, chronic neck and back pain.    Examination-Activity Limitations Lift;Carry;Locomotion Level;Reach Overhead    Examination-Participation Restrictions Church;Cleaning;Community Activity    Stability/Clinical Decision Making Evolving/Moderate complexity    Rehab Potential Good    PT Frequency 2x / week    PT Duration 6 weeks    PT Treatment/Interventions ADLs/Self Care Home Management;Cryotherapy;Electrical Stimulation;Moist Heat;Traction;Ultrasound;Gait training;Stair training;Functional mobility training;Therapeutic activities;Therapeutic exercise;Neuromuscular re-education;Manual techniques;Passive range of motion;Dry needling;Taping;Spinal Manipulations;Joint Manipulations    PT Next Visit Plan review HEP, core strengthening, manual therapy, cervical traction unit    PT Home Exercise Plan Access Code: [email or web address]    Consulted and Agree with Plan of Care Patient             Patient will benefit from skilled therapeutic  intervention in order to improve  the following deficits and impairments:  Decreased activity tolerance, Decreased endurance, Decreased range of motion, Decreased strength, Impaired UE functional use, Improper body mechanics, Pain, Decreased mobility, Difficulty walking, Increased muscle spasms, Postural dysfunction  Visit Diagnosis: Cervicalgia  Chronic midline low back pain without sciatica  Difficulty in walking, not elsewhere classified  Cramp and spasm  Muscle weakness (generalized)     Problem List Patient Active Problem List   Diagnosis Date Noted   Essential hypertension 03/01/2018   Hyperlipidemia 03/01/2018   Chest pain 03/01/2018   Unstable angina ([place]) 02/27/2018   Fracture dislocation of left elbow joint 12/13/2013    Rennie Natter, PT, DPT 01/09/2021, 5:02 PM  [facility] [address]  [place], [place], [address] Phone: [phone]   Fax:  [phone]  Name: Aija Scarfo MRN: [id number] Date of Birth: [birth year]

## 2021-01-13 ENCOUNTER — Other Ambulatory Visit: Payer: Self-pay

## 2021-01-13 ENCOUNTER — Ambulatory Visit: Payer: Medicare PPO | Attending: Orthopaedic Surgery | Admitting: Physical Therapy

## 2021-01-13 ENCOUNTER — Encounter: Payer: Self-pay | Admitting: Physical Therapy

## 2021-01-13 DIAGNOSIS — R252 Cramp and spasm: Secondary | ICD-10-CM | POA: Diagnosis present

## 2021-01-13 DIAGNOSIS — M545 Low back pain, unspecified: Secondary | ICD-10-CM | POA: Insufficient documentation

## 2021-01-13 DIAGNOSIS — G8929 Other chronic pain: Secondary | ICD-10-CM | POA: Diagnosis present

## 2021-01-13 DIAGNOSIS — M6281 Muscle weakness (generalized): Secondary | ICD-10-CM | POA: Insufficient documentation

## 2021-01-13 DIAGNOSIS — M542 Cervicalgia: Secondary | ICD-10-CM | POA: Diagnosis not present

## 2021-01-13 DIAGNOSIS — R262 Difficulty in walking, not elsewhere classified: Secondary | ICD-10-CM | POA: Insufficient documentation

## 2021-01-13 NOTE — Therapy (Addendum)
PHYSICAL THERAPY DISCHARGE SUMMARY (02/19/2021)   Visits from Start of Care: 11  Current functional level related to goals / functional outcomes: All goals met   Remaining deficits: continued to report stiffness in neck   Education / Equipment: HEP  Plan: Patient agrees to discharge.  Patient is being discharged due to meeting the stated rehab goals.  Patient was placed on 30 day hold on 01/13/2021 to see if able to manage neck pain/stiffness with HEP and did not return within that time frame.     Rennie Natter, PT, DPT 02/19/2021   [facility] [address]  [place], [place], [address] Phone: [phone]   Fax:  [phone]  Physical Therapy Treatment  Patient Details  Name: Deanna Compton MRN: [id number] Date of Birth: [birth year] Referring Provider (PT): Starling Manns   Encounter Date: 01/13/2021   PT End of Session - 01/13/21 1452     Visit Number 11    Number of Visits 12    Date for PT Re-Evaluation 01/14/21    Authorization Type [employer] MCR    Authorization Time Period 12/03/20-01/14/21    Authorization - Visit Number 12    Progress Note Due on Visit 10    PT Start Time 2863    PT Stop Time 8177    PT Time Calculation (min) 43 min    Activity Tolerance Patient tolerated treatment well;Patient limited by pain    Behavior During Therapy [facility] for tasks assessed/performed             Past Medical History:  Diagnosis Date   Arthritis    Glaucoma    Hyperlipemia    Hypertension     Past Surgical History:  Procedure Laterality Date   ABDOMINAL HYSTERECTOMY     APPENDECTOMY     EYE SURGERY     LEFT HEART CATH AND CORONARY ANGIOGRAPHY N/A 02/28/2018   Procedure: LEFT HEART CATH AND CORONARY ANGIOGRAPHY;  Surgeon: Lorretta Harp, MD;  Location: [facility];  Service: Cardiovascular;  Laterality: N/A;   ORIF ELBOW FRACTURE Left 12/12/2013   Procedure: OPEN REDUCTION INTERNAL FIXATION (ORIF)  ELBOW/OLECRANON FRACTURE;  Surgeon: Roseanne Kaufman, MD;  Location: [facility];  Service: Orthopedics;  Laterality: Left;   TONSILLECTOMY      There were no vitals filed for this visit.   Subjective Assessment - 01/13/21 1450     Subjective Patient reports she is doing well, neck is "pretty good".  She had a litte back pain this morning but was able to work it out with her exercises.    Currently in Pain? Yes    Pain Score 2     Pain Location Neck    Pain Orientation Lower    Pain Descriptors / Indicators Tightness                               [facility] Adult PT Treatment/Exercise - 01/13/21 0001       Neck Exercises: Machines for Strengthening   UBE (Upper Arm Bike) L1 3 min foward, 3 min back      Neck Exercises: Stretches   Levator Stretch Right;Left;3 reps;20 seconds    Levator Stretch Limitations pin and stretch      Manual Therapy   Manual Therapy Soft tissue mobilization;Joint mobilization    Manual therapy comments cervical region in supine.    Joint Mobilization PA mobs to  cervical spine grade 2-3, NAGS into rotation    Soft tissue mobilization STM/TPR to bil UT and levator scapulae    Manual Traction suboccipital release, gentle pulls x 15 se                     PT Education - 01/13/21 1718     Education Details added levator stretch    Person(s) Educated Patient    Methods Explanation;Demonstration;Handout    Comprehension Verbalized understanding;Returned demonstration              PT Short Term Goals - 12/24/20 1841       PT SHORT TERM GOAL #1   Title Pt. will be independent with initial HEP    Time 2    Period Weeks    Status Achieved    Target Date 12/17/20               PT Long Term Goals - 01/13/21 1718       PT LONG TERM GOAL #1   Title Pt. will be compliant with progressed HEP    Time 6    Period Weeks    Status Achieved   01/09/21- met for current     PT LONG TERM GOAL #2   Title Pt. will demonstrate  improved cervical ROM with 60 deg rotation bil for safety with driving.    Baseline see flowsheet    Time 6    Period Weeks    Status Achieved   12/26/20- 65 deg cervical rotation bil.     PT LONG TERM GOAL #3   Title Pt. will report no more than 4/10 LBP with activities like cleaning.    Time 6    Period Weeks    Status Achieved   01/13/21- able to manage pain with exercises.     PT LONG TERM GOAL #4   Title Pt. will be able to walk without limitation from LBP.    Time 6    Period Weeks    Status Achieved   01/03/19- no limitation from back pain with walking, able to walk 1 mile with treadmill                  Plan - 01/13/21 1453     Clinical Impression Statement Jacqlyn Larsen has made good progress and has met all of her goals.  She continues to have tightness/stiffness in cervical spine, but able to manage pain with exercises.  We did discuss getting regular massages to also help manage these symptoms, and added levator stretch to exercises today.  Otherwise focused on manual therapy to decrease muscle tightness.  She is agreeable today to 30 day hold to see if she can continue to manage symptoms without PT.    Personal Factors and Comorbidities Age;Comorbidity 3+;Time since onset of injury/illness/exacerbation    Comorbidities HTN, history RTA, chronic neck and back pain.    Examination-Activity Limitations Lift;Carry;Locomotion Level;Reach Overhead    Examination-Participation Restrictions Church;Cleaning;Community Activity    Stability/Clinical Decision Making Evolving/Moderate complexity    Rehab Potential Good    PT Frequency 2x / week    PT Duration 6 weeks    PT Treatment/Interventions ADLs/Self Care Home Management;Cryotherapy;Electrical Stimulation;Moist Heat;Traction;Ultrasound;Gait training;Stair training;Functional mobility training;Therapeutic activities;Therapeutic exercise;Neuromuscular re-education;Manual techniques;Passive range of motion;Dry needling;Taping;Spinal  Manipulations;Joint Manipulations    PT Next Visit Plan review HEP, core strengthening, manual therapy, cervical traction unit    PT Home Exercise Plan Access Code: [email or web address]    Consulted and  Agree with Plan of Care Patient             Patient will benefit from skilled therapeutic intervention in order to improve the following deficits and impairments:  Decreased activity tolerance, Decreased endurance, Decreased range of motion, Decreased strength, Impaired UE functional use, Improper body mechanics, Pain, Decreased mobility, Difficulty walking, Increased muscle spasms, Postural dysfunction  Visit Diagnosis: Cervicalgia  Chronic midline low back pain without sciatica  Difficulty in walking, not elsewhere classified  Cramp and spasm  Muscle weakness (generalized)     Problem List Patient Active Problem List   Diagnosis Date Noted   Essential hypertension 03/01/2018   Hyperlipidemia 03/01/2018   Chest pain 03/01/2018   Unstable angina ([place]) 02/27/2018   Fracture dislocation of left elbow joint 12/13/2013    Rennie Natter, PT, DPT 01/13/2021, 5:22 PM  [facility] [address]  [place], [place], [address] Phone: [phone]   Fax:  [phone]  Name: Neva Ramaswamy MRN: [id number] Date of Birth: 12-May-1945

## 2021-01-13 NOTE — Patient Instructions (Signed)
Access Code: [email or web address] URL: [email or web address] Date: 01/13/2021 Prepared by: Harrie Foreman  Exercises Seated Levator Scapulae Stretch - 1 x daily - 7 x weekly - 1 sets - 3 reps - 15 hold

## 2021-01-15 ENCOUNTER — Encounter: Payer: Medicare PPO | Admitting: Physical Therapy

## 2021-01-20 ENCOUNTER — Encounter: Payer: Medicare PPO | Admitting: Physical Therapy

## 2021-01-22 ENCOUNTER — Encounter: Payer: Medicare PPO | Admitting: Physical Therapy

## 2021-03-19 ENCOUNTER — Ambulatory Visit: Payer: Medicare PPO | Admitting: Internal Medicine

## 2021-03-25 ENCOUNTER — Encounter: Payer: Self-pay | Admitting: Internal Medicine

## 2021-03-25 ENCOUNTER — Ambulatory Visit: Payer: Medicare PPO | Admitting: Internal Medicine

## 2021-03-25 ENCOUNTER — Other Ambulatory Visit: Payer: Self-pay

## 2021-03-25 VITALS — BP 125/64 | HR 58 | Ht 64.0 in | Wt 157.2 lb

## 2021-03-25 DIAGNOSIS — R0989 Other specified symptoms and signs involving the circulatory and respiratory systems: Secondary | ICD-10-CM

## 2021-03-25 NOTE — Patient Instructions (Signed)
Medication Instructions:  No Changes In Medications at this time.  *If you need a refill on your cardiac medications before your next appointment, please call your pharmacy*  Lab Work: BNP- TODAY  If you have labs (blood work) drawn today and your tests are completely normal, you will receive your results only by: MyChart Message (if you have MyChart) OR A paper copy in the mail If you have any lab test that is abnormal or we need to change your treatment, we will call you to review the results.  Follow-Up: At [facility], you and your health needs are our priority.  As part of our continuing mission to provide you with exceptional heart care, we have created designated Provider Care Teams.  These Care Teams include your primary Cardiologist (physician) and Advanced Practice Providers (APPs -  Physician Assistants and Nurse Practitioners) who all work together to provide you with the care you need, when you need it.  Your next appointment:   AS NEEDED   The format for your next appointment:   In Person  Provider:   Maisie Fus, MD

## 2021-03-25 NOTE — Progress Notes (Signed)
Cardiology Office Note:    Date:  03/25/2021   ID:  Deanna Compton, DOB February 22, 1946, MRN [id number]  PCP:  Belenda Cruise, NP   [facility] Providers Cardiologist:  Maisie Fus, MD     Referring MD: No ref. provider found   No chief complaint on file. Diastolic Dysfunction  History of Present Illness:    Deanna Compton is a 75 y.o. female with a hx of HTN, HLD who saw Deanna Compton/Dr. Tenny Craw for hospital FU of non cardiac chest pain 03/15/2018 referral for concern of ?CHF  In 2019, per Dr. Charlott Rakes note: She was admitted to the hospital from 02/27/18-03/01/18 after presenting with chest pain felt to be 2/2 unstable angina. Her troponins were negative. Echo revealed EF 60-65% and G1DD. She underwent a LHC which revealed normal coronaries, suggestive of non-cardiac cause of chest pain. She was recommended to follow-up with her PCP/GI for possible GI etiology of chest pain. She recommended pepcid. She has hypertension and continued metop 12.5 mg BID and telmisartan 80 mg daily. Has hyperlipidemia LDL 73 in 2019, on atorvastatin 20 mg daily.  Since then, she's been doing well. She had an echo at [facility] [place]. Echo showed normal EF, grade II diastolic dysfunction. Mild MR.  LA size 40 ml/m2.  No wall motion. No significant valve dx. She stated that her PCP was concerning for pre-heart failure based on these findings and requested referral.  She gets fatigued. She gets an occasional pain in the left side with pressure. She had these symptoms prior before her heart cath. She has some swelling her legs. She gets some SOB with walking the dog. She denies PND, orthopnea or PND.  In terms of her blood pressures. Has long log from 2021. Lately SBP 120s-140s. Diastolic 80-85. She has well controlled today.   BNP in May was negative   Cardiology Studies  TTE 02/28/2018 - Left ventricle: The cavity size was normal. Wall thickness was    normal. Systolic function was normal. The estimated  ejection    fraction was in the range of 60% to 65%. Wall motion was normal;    there were no regional wall motion abnormalities. Doppler    parameters are consistent with abnormal left ventricular    relaxation (grade 1 diastolic dysfunction). The E/e&' ratio is    between 8-15, suggesting indeterminate LV filling pressure.  - Mitral valve: Mildly thickened leaflets . There was trivial    regurgitation.  - Left atrium: The atrium was normal in size.  - Tricuspid valve: There was trivial regurgitation.  - Pulmonic valve: There was mild regurgitation.  - Pulmonary arteries: PA peak pressure: 25 mm Hg (S).  - Inferior vena cava: The vessel was normal in size. The    respirophasic diameter changes were in the normal range (= 50%),    consistent with normal central venous pressure  LHC- normal cors   Past Medical History:  Diagnosis Date   Arthritis    Glaucoma    Hyperlipemia    Hypertension     Past Surgical History:  Procedure Laterality Date   ABDOMINAL HYSTERECTOMY     APPENDECTOMY     EYE SURGERY     LEFT HEART CATH AND CORONARY ANGIOGRAPHY N/A 02/28/2018   Procedure: LEFT HEART CATH AND CORONARY ANGIOGRAPHY;  Surgeon: Runell Gess, MD;  [facility];  Service: Cardiovascular;  Laterality: N/A;   ORIF ELBOW FRACTURE Left 12/12/2013   Procedure: OPEN REDUCTION INTERNAL FIXATION (ORIF)  ELBOW/OLECRANON FRACTURE;  Surgeon: Dominica Severin, MD;  Location: [facility];  Service: Orthopedics;  Laterality: Left;   TONSILLECTOMY      Current Medications: Current Meds  Medication Sig   acetaminophen (TYLENOL) 650 MG CR tablet Take 650 mg by mouth every 8 (eight) hours as needed for pain. Takes 2 in AM, 2 in PM   alendronate (FOSAMAX) 70 MG tablet Take 70 mg by mouth once a week. Take with a full glass of water on an empty stomach.   amLODipine (NORVASC) 5 MG tablet Take 5 mg by mouth daily.   atorvastatin (LIPITOR) 20 MG tablet Take 1 tablet by mouth daily.    bimatoprost (LUMIGAN) 0.03 % ophthalmic solution Place 1 drop into both eyes at bedtime.    Calcium Carb-Cholecalciferol (CALTRATE 600+D3 PO) Take by mouth.   diclofenac Sodium (VOLTAREN) 1 % GEL Apply topically 4 (four) times daily. PRN   furosemide (LASIX) 20 MG tablet Take 20 mg by mouth. PRN for leg swelling, take 3 days at a time in morning   LUMIGAN 0.01 % SOLN SMARTSIG:In Eye(s)   Magnesium 250 MG TABS magnesium   Melatonin 10 MG TABS Take 20 mg by mouth at bedtime.   Multiple Vitamins-Minerals (WOMENS MULTIVITAMIN PO) Women's Multivitamin   olmesartan (BENICAR) 40 MG tablet Take 40 mg by mouth daily.   omega-3 acid ethyl esters (LOVAZA) 1 G capsule Take 1 g by mouth 3 (three) times daily.   polyethylene glycol (MIRALAX / GLYCOLAX) packet Take 17 g by mouth daily as needed for mild constipation.    potassium chloride SA (KLOR-CON M) 20 MEQ tablet Take 20 mEq by mouth once. When taking Furosemide   pregabalin (LYRICA) 75 MG capsule Take 75 mg by mouth 2 (two) times daily.   solifenacin (VESICARE) 5 MG tablet Take 2.5 mg by mouth daily.    tiZANidine (ZANAFLEX) 4 MG tablet Take 4 mg by mouth once. At night if needed   TURMERIC CURCUMIN PO Take 1 capsule by mouth daily.      Allergies:   Sulfa antibiotics   Social History   Socioeconomic History   Marital status: Married    Spouse name: Not on file   Number of children: Not on file   Years of education: Not on file   Highest education level: Not on file  Occupational History   Not on file  Tobacco Use   Smoking status: Never   Smokeless tobacco: Never  Substance and Sexual Activity   Alcohol use: No   Drug use: No   Sexual activity: Never  Other Topics Concern   Not on file  Social History Narrative   Not on file   Social Determinants of Health   Financial Resource Strain: Not on file  Food Insecurity: Not on file  Transportation Needs: Not on file  Physical Activity: Not on file  Stress: Not on file  Social  Connections: Not on file     Family History: The patient's family history includes Heart disease in her mother; Hypertension in her father and mother.  ROS:   Please see the history of present illness.     All other systems reviewed and are negative.  EKGs/Labs/Other Studies Reviewed:    The following studies were reviewed today:   EKG:  EKG is  ordered today.  The ekg ordered today demonstrates   Sinus bradycardia HR 58, poor anterior R wave progression  Recent Labs: 12/23/2020: ALT 27; B Natriuretic Peptide 11.0; BUN 30; Creatinine, Ser  0.83; Hemoglobin 12.9; Platelets 301; Potassium 4.0; Sodium 133; TSH 1.544  Recent Lipid Panel    Component Value Date/Time   CHOL 132 02/28/2018 0408   TRIG 96 02/28/2018 0408   HDL 40 (L) 02/28/2018 0408   CHOLHDL 3.3 02/28/2018 0408   VLDL 19 02/28/2018 0408   LDLCALC 73 02/28/2018 0408     Risk Assessment/Calculations:           Physical Exam:    VS:    Vitals:   03/25/21 0912  BP: 125/64  Pulse: (!) 58  SpO2: 97%     Wt Readings from Last 3 Encounters:  03/25/21 157 lb 3.2 oz (71.3 kg)  12/23/20 160 lb (72.6 kg)  10/29/18 169 lb (76.7 kg)     GEN:  Well nourished, well developed in no acute distress HEENT: Normal NECK: No JVD; No carotid bruits LYMPHATICS: No lymphadenopathy CARDIAC: RRR, no murmurs, rubs, gallops RESPIRATORY:  Clear to auscultation without rales, wheezing or rhonchi  ABDOMEN: Soft, non-tender, non-distended MUSCULOSKELETAL:  No edema; No deformity  SKIN: Warm and dry NEUROLOGIC:  Alert and oriented x 3 PSYCHIATRIC:  Normal affect   ASSESSMENT:    #Diastolic Dysfunction: She noted concern from her PCP for heart failure. Per echo at [facility], EF was normal. She has diastolic dysfunction but no signs or symptoms of congestive decompensated heart failure. Diastolic dysfunction is not uncommon with aging. Will plan for a BNP to be sure. Her blood pressures are in a good range and she has had  hypotension before. Recommend continuing current regimen. Otherwise will plan for FU as needed. PLAN:    In order of problems listed above:  Follow up PRN     Medication Adjustments/Labs and Tests Ordered: Current medicines are reviewed at length with the patient today.  Concerns regarding medicines are outlined above.  Orders Placed This Encounter  Procedures   Brain natriuretic peptide   EKG 12-Lead   No orders of the defined types were placed in this encounter.   Patient Instructions  Medication Instructions:  No Changes In Medications at this time.  *If you need a refill on your cardiac medications before your next appointment, please call your pharmacy*  Lab Work: BNP- TODAY  If you have labs (blood work) drawn today and your tests are completely normal, you will receive your results only by: MyChart Message (if you have MyChart) OR A paper copy in the mail If you have any lab test that is abnormal or we need to change your treatment, we will call you to review the results.  Follow-Up: At [facility], you and your health needs are our priority.  As part of our continuing mission to provide you with exceptional heart care, we have created designated Provider Care Teams.  These Care Teams include your primary Cardiologist (physician) and Advanced Practice Providers (APPs -  Physician Assistants and Nurse Practitioners) who all work together to provide you with the care you need, when you need it.  Your next appointment:   AS NEEDED   The format for your next appointment:   In Person  Provider:   Maisie Fus, MD     Signed, Maisie Fus, MD  03/25/2021 10:01 AM     [facility]

## 2021-03-26 LAB — BRAIN NATRIURETIC PEPTIDE: BNP: 38.5 pg/mL (ref 0.0–100.0)

## 2021-12-29 ENCOUNTER — Ambulatory Visit (INDEPENDENT_AMBULATORY_CARE_PROVIDER_SITE_OTHER): Payer: Medicare PPO

## 2021-12-29 ENCOUNTER — Ambulatory Visit
Admission: RE | Admit: 2021-12-29 | Discharge: 2021-12-29 | Disposition: A | Discharge status: Home / Self Care | Payer: Medicare PPO | Source: Ambulatory Visit

## 2021-12-29 VITALS — BP 173/74 | HR 63 | Temp 98.0°F | Resp 14

## 2021-12-29 DIAGNOSIS — R5383 Other fatigue: Secondary | ICD-10-CM | POA: Diagnosis not present

## 2021-12-29 DIAGNOSIS — R059 Cough, unspecified: Secondary | ICD-10-CM

## 2021-12-29 MED ORDER — AZITHROMYCIN 250 MG PO TABS: 250.0000 mg | ORAL_TABLET | Freq: Every day | ORAL | 0 refills | Status: DC

## 2021-12-29 MED ORDER — PREDNISONE 50 MG PO TABS: ORAL_TABLET | ORAL | 0 refills | Status: DC

## 2021-12-29 NOTE — ED Triage Notes (Signed)
Pt presents with c/o cough, fatigue, and intermittent sore throat 1 week. Pt states exposure to RSV. Per recent lab work pts states she was diagnosed with anemia. Home covid negative x 3.

## 2021-12-29 NOTE — ED Provider Notes (Signed)
[facility]    CSN: [id number] Arrival date & time: 12/29/21  1842      History   Chief Complaint Chief Complaint  Patient presents with   Sore Throat   Cough   Fatigue    HPI Deanna Compton is a 76 y.o. female.   HPI 76 year old female presents with cough, fatigue, intermittent sore throat for 1 week.  Patient reports exposure to RSV.  Patient reports recent lab work reveals anemia.  Additionally, patient reports 3 negative home COVID-19 test.  PMH significant for CAD, unstable angina, and HTN.  Patient is accompanied by her husband this evening.  Patient was evaluated by PCP for fatigue on 12/12/2021.  Past Medical History:  Diagnosis Date   Arthritis    Glaucoma    Hyperlipemia    Hypertension     Patient Active Problem List   Diagnosis Date Noted   Essential hypertension 03/01/2018   Hyperlipidemia 03/01/2018   Chest pain 03/01/2018   Unstable angina (HCC) 02/27/2018   Fracture dislocation of left elbow joint 12/13/2013    Past Surgical History:  Procedure Laterality Date   ABDOMINAL HYSTERECTOMY     APPENDECTOMY     EYE SURGERY     LEFT HEART CATH AND CORONARY ANGIOGRAPHY N/A 02/28/2018   Procedure: LEFT HEART CATH AND CORONARY ANGIOGRAPHY;  Surgeon: Runell Gess, MD;  Location: [facility];  Service: Cardiovascular;  Laterality: N/A;   ORIF ELBOW FRACTURE Left 12/12/2013   Procedure: OPEN REDUCTION INTERNAL FIXATION (ORIF) ELBOW/OLECRANON FRACTURE;  Surgeon: Dominica Severin, MD;  Location: [facility] OR;  Service: Orthopedics;  Laterality: Left;   TONSILLECTOMY      OB History   No obstetric history on file.      Home Medications    Prior to Admission medications   Medication Sig Start Date End Date Taking? Authorizing Provider  azithromycin (ZITHROMAX) 250 MG tablet Take 1 tablet (250 mg total) by mouth daily. Take first 2 tablets together, then 1 every day until finished. 12/29/21  Yes Trevor Iha, FNP  gabapentin (NEURONTIN) 800 MG  tablet Take 800 mg by mouth 3 (three) times daily.   Yes [provider]  omeprazole (PRILOSEC) 40 MG capsule Take 40 mg by mouth daily.   Yes [provider]  predniSONE (DELTASONE) 50 MG tablet Take 1 tab p.o. daily for 5 days. 12/29/21  Yes Trevor Iha, FNP  Turmeric (CURCUMIN 95 PO) Take by mouth.   Yes [provider]  acetaminophen (TYLENOL) 650 MG CR tablet Take 650 mg by mouth every 8 (eight) hours as needed for pain. Takes 2 in AM, 2 in PM    [provider]  alendronate (FOSAMAX) 70 MG tablet Take 70 mg by mouth once a week. Take with a full glass of water on an empty stomach.    [provider]  amLODipine (NORVASC) 5 MG tablet Take 5 mg by mouth daily.    [provider]  atorvastatin (LIPITOR) 20 MG tablet Take 1 tablet by mouth daily. 06/22/18   [provider]  atorvastatin (LIPITOR) 40 MG tablet Take 0.5 tablets (20 mg total) by mouth daily at 6 PM. Patient not taking: Reported on 03/25/2021 03/01/18   Riley Nearing, Ovidio Kin., PA-C  bimatoprost (LUMIGAN) 0.03 % ophthalmic solution Place 1 drop into both eyes at bedtime.     [provider]  Bioflavonoid Products (C COMPLEX) TBCR Take 1,000 mg by mouth daily. Patient not taking: Reported on 03/25/2021  [provider]  Calcium Carb-Cholecalciferol (CALTRATE 600+D3 PO) Take by mouth.    [provider]  calcium carbonate (OS-CAL - DOSED IN MG OF ELEMENTAL CALCIUM) 1250 MG tablet Take 1 tablet by mouth daily with breakfast. Patient not taking: Reported on 03/25/2021    [provider]  cholecalciferol (VITAMIN D) 1000 UNITS tablet Take 1,000 Units by mouth daily. Patient not taking: Reported on 03/25/2021    [provider]  diclofenac Sodium (VOLTAREN) 1 % GEL Apply topically 4 (four) times daily. PRN    [provider]  famotidine (PEPCID) 20 MG tablet Take 20 mg by mouth 2 (two) times daily. Patient not taking:  Reported on 03/25/2021    [provider]  furosemide (LASIX) 20 MG tablet Take 20 mg by mouth. PRN for leg swelling, take 3 days at a time in morning    [provider]  hydrALAZINE (APRESOLINE) 25 MG tablet Take 25 mg by mouth once. Patient reports only taking once daily in AM starting on 10/04/20 Patient not taking: Reported on 03/25/2021    [provider]  LUMIGAN 0.01 % SOLN SMARTSIG:In Eye(s) 12/03/20   [provider]  Magnesium 250 MG TABS magnesium    [provider]  Melatonin 10 MG TABS Take 20 mg by mouth at bedtime. Patient not taking: Reported on 12/29/2021    [provider]  metoprolol tartrate (LOPRESSOR) 25 MG tablet TAKE 1/2 TABLET BY MOUTH TWICE DAILY Patient not taking: Reported on 03/25/2021 08/06/18   Abigail Butts., PA-C  Multiple Vitamin (MULTIVITAMIN WITH MINERALS) TABS tablet Take 1 tablet by mouth daily. Patient not taking: Reported on 03/25/2021    [provider]  Multiple Vitamins-Minerals (WOMENS MULTIVITAMIN PO) Women's Multivitamin    [provider]  olmesartan (BENICAR) 40 MG tablet Take 40 mg by mouth daily. Patient not taking: Reported on 12/29/2021    [provider]  omega-3 acid ethyl esters (LOVAZA) 1 G capsule Take 1 g by mouth 3 (three) times daily.    [provider]  polyethylene glycol (MIRALAX / GLYCOLAX) packet Take 17 g by mouth daily as needed for mild constipation.     [provider]  potassium chloride SA (KLOR-CON M) 20 MEQ tablet Take 20 mEq by mouth once. When taking Furosemide    [provider]  pregabalin (LYRICA) 75 MG capsule Take 75 mg by mouth 2 (two) times daily.    [provider]  solifenacin (VESICARE) 5 MG tablet Take 2.5 mg by mouth daily.     [provider]  telmisartan (MICARDIS) 80 MG tablet Take 80 mg by mouth daily. Patient not taking: Reported on 03/25/2021    [provider]   tiZANidine (ZANAFLEX) 4 MG tablet Take 4 mg by mouth once. At night if needed    [provider]  TURMERIC CURCUMIN PO Take 1 capsule by mouth daily.     [provider]    Family History Family History  Problem Relation Age of Onset   Hypertension Mother    Heart disease Mother    Hypertension Father     Social History Social History   Tobacco Use   Smoking status: Never   Smokeless tobacco: Never  Substance Use Topics   Alcohol use: No   Drug use: No     Allergies   Sulfa antibiotics   Review of Systems Review of Systems  Constitutional:  Positive for fatigue.  HENT:  Positive for sore throat.  Respiratory:  Positive for cough.   All other systems reviewed and are negative.    Physical Exam Triage Vital Signs ED Triage Vitals  Enc Vitals Group     BP 12/29/21 1853 (!) 173/74     Pulse Rate 12/29/21 1853 63     Resp 12/29/21 1853 14     Temp 12/29/21 1853 98 F (36.7 C)     Temp Source 12/29/21 1853 Oral     SpO2 12/29/21 1853 94 %     Weight --      Height --      Head Circumference --      Peak Flow --      Pain Score 12/29/21 1854 0     Pain Loc --      Pain Edu? --      Excl. in GC? --    No data found.  Updated Vital Signs BP (!) 173/74 (BP Location: Right Arm)   Pulse 63   Temp 98 F (36.7 C) (Oral)   Resp 14   SpO2 94%    Physical Exam Vitals and nursing note reviewed.  Constitutional:      General: She is not in acute distress.    Appearance: Normal appearance. She is well-developed and normal weight. She is not ill-appearing.  HENT:     Head: Normocephalic and atraumatic.     Right Ear: Tympanic membrane, ear canal and external ear normal.     Left Ear: Tympanic membrane, ear canal and external ear normal.     Mouth/Throat:     Mouth: Mucous membranes are moist.     Pharynx: Oropharynx is clear. Uvula midline. No posterior oropharyngeal erythema or uvula swelling.  Eyes:     Extraocular Movements:  Extraocular movements intact.     Conjunctiva/sclera: Conjunctivae normal.     Pupils: Pupils are equal, round, and reactive to light.  Cardiovascular:     Rate and Rhythm: Normal rate and regular rhythm.     Pulses: Normal pulses.     Heart sounds: Normal heart sounds. No murmur heard.    No friction rub. No gallop.  Pulmonary:     Effort: Pulmonary effort is normal.     Breath sounds: Normal breath sounds. No wheezing, rhonchi or rales.  Musculoskeletal:        General: Normal range of motion.     Cervical back: Normal range of motion and neck supple.  Skin:    General: Skin is warm and dry.  Neurological:     General: No focal deficit present.     Mental Status: She is alert and oriented to person, place, and time.      UC Treatments / Results  Labs (all labs ordered are listed, but only abnormal results are displayed) Labs Reviewed  RESP PANEL BY RT-PCR (RSV, FLU A&B, COVID)  [id number]    EKG   Radiology DG Chest 2 View  Result Date: 12/29/2021 CLINICAL DATA:  Cough, fatigue EXAM: CHEST - 2 VIEW COMPARISON:  12/23/2020 FINDINGS: Cardiac size is within normal limits. Lung fields are clear of any infiltrates or pulmonary edema. There is no pleural effusion or pneumothorax. No significant interval changes are noted in the lung fields. IMPRESSION: No active cardiopulmonary disease. Electronically Signed   By: Ernie Avena M.D.   On: 12/29/2021 19:30    Procedures Procedures (including critical care time)  Medications Ordered in UC Medications - No data to display  Initial Impression / Assessment and Plan /  UC Course  I have reviewed the triage vital signs and the nursing notes.  Pertinent labs & imaging results that were available during my care of the patient were reviewed by me and considered in my medical decision making (see chart for details).     MDM: 1.  Fatigue, unspecified type-CXR revealed above, 2.  Cough Rx'd Zithromax and prednisone.  Advised/informed patient of chest x-ray results with hard copy provided to patient.  Advised patient to take medication as directed with food to completion.  Advised patient to take prednisone with Zithromax daily for the next 5 days.  Encouraged patient to increase daily water intake while taking these medications.  Advised patient we will follow-up with COVID-19/influenza/RSV results once received.  Advised patient if symptoms worsen and/or unresolved please follow-up with PCP for further evaluation.  Patient discharged home, hemodynamically stable Final Clinical Impressions(s) / UC Diagnoses   Final diagnoses:  Fatigue, unspecified type  Cough, unspecified type     Discharge Instructions      Advised/informed patient of chest x-ray results with hard copy provided to patient.  Advised patient to take medication as directed with food to completion.  Advised patient to take prednisone with Zithromax daily for the next 5 days.  Encouraged patient to increase daily water intake while taking these medications.  Advised patient we will follow-up with COVID-19/influenza/RSV results once received.  Advised patient if symptoms worsen and/or unresolved please follow-up with PCP for further evaluation.     ED Prescriptions     Medication Sig Dispense Auth. Provider   azithromycin (ZITHROMAX) 250 MG tablet Take 1 tablet (250 mg total) by mouth daily. Take first 2 tablets together, then 1 every day until finished. 6 tablet Trevor Ihaagan, Greene Diodato, FNP   predniSONE (DELTASONE) 50 MG tablet Take 1 tab p.o. daily for 5 days. 5 tablet Trevor Ihaagan, Sebastyan Snodgrass, FNP      PDMP not reviewed this encounter.   Trevor IhaRagan, Rainn Zupko, FNP 12/29/21 1941

## 2021-12-29 NOTE — Discharge Instructions (Addendum)
Advised/informed patient of chest x-ray results with hard copy provided to patient.  Advised patient to take medication as directed with food to completion.  Advised patient to take prednisone with Zithromax daily for the next 5 days.  Encouraged patient to increase daily water intake while taking these medications.  Advised patient we will follow-up with COVID-19/influenza/RSV results once received.  Advised patient if symptoms worsen and/or unresolved please follow-up with PCP for further evaluation.

## 2021-12-30 ENCOUNTER — Telehealth: Payer: Self-pay

## 2021-12-30 LAB — RESP PANEL BY RT-PCR (RSV, FLU A&B, COVID)  RVPGX2
Influenza A by PCR: NEGATIVE
Influenza B by PCR: NEGATIVE
Resp Syncytial Virus by PCR: NEGATIVE
SARS Coronavirus 2 by RT PCR: NEGATIVE

## 2021-12-30 NOTE — Telephone Encounter (Signed)
Called pt to check on status since UC visit. Left msg advising call back if any questions or concerns.

## 2022-03-22 ENCOUNTER — Ambulatory Visit
Admission: EM | Admit: 2022-03-22 | Discharge: 2022-03-22 | Disposition: A | Discharge status: Home / Self Care | Payer: Medicare PPO | Attending: Family Medicine | Admitting: Family Medicine

## 2022-03-22 ENCOUNTER — Encounter: Payer: Self-pay | Admitting: Emergency Medicine

## 2022-03-22 DIAGNOSIS — J069 Acute upper respiratory infection, unspecified: Secondary | ICD-10-CM

## 2022-03-22 DIAGNOSIS — M545 Low back pain, unspecified: Secondary | ICD-10-CM

## 2022-03-22 MED ORDER — TIZANIDINE HCL 4 MG PO TABS: 4.0000 mg | ORAL_TABLET | Freq: Four times a day (QID) | ORAL | 0 refills | Status: AC | PRN

## 2022-03-22 MED ORDER — HYDROCODONE-ACETAMINOPHEN 5-325 MG PO TABS: 1.0000 | ORAL_TABLET | Freq: Four times a day (QID) | ORAL | 0 refills | Status: AC | PRN

## 2022-03-22 MED ORDER — KETOROLAC TROMETHAMINE 30 MG/ML IJ SOLN
30.0000 mg | Freq: Once | INTRAMUSCULAR | Status: AC
  Administered 2022-03-22: 30 mg via INTRAMUSCULAR

## 2022-03-22 NOTE — ED Triage Notes (Addendum)
Right lower back pain after having a bowel movement this am  Biofreeze in place  Tylenol 8hr -2 tabs at 0800 Robaxin at 0800 (husband's script) On medrol dose pak (year old) for right leg pain -started 3 days ago  Cough x 4 days ( OTC cough meds)  Pt returned from a 2 week cruise last night

## 2022-03-22 NOTE — Discharge Instructions (Signed)
Take tylenol for moderate pain Take the tylenol with hydrocodone if pain is severe Continue the dosepak May use tizanidine as needed as muscle relaxer Ice or heat to painful muscles Call your doctor if not improving in a few days

## 2022-03-22 NOTE — ED Provider Notes (Addendum)
[facility]    CSN: [id number] Arrival date & time: 03/22/22  1111      History   Chief Complaint Chief Complaint  Patient presents with   Back Pain    right    HPI Deanna Compton is a 76 y.o. female.   HPI  Patient states that she woke up at 6:00 in the morning and needed to go to the bathroom.  She states that she had some loose bowels.  As she was reaching behind her with her right arm to clean herself, she had a sudden onset of severe right low back pain.  This pain has persisted.  She states with any movement it causes a severe stabbing pain.  It is localized to the right low back.  No radiation Patient has known lumbar degenerative disc disease with some radiation on the right side around the front of her leg.  This is chronic.  She has not noticed it today.  She is under the care of a spine specialist for this. She is also recently had an Pez anserine bursitis of her right knee and had an injection on this on 11/20 Also complains of a cough for 4 days  Past Medical History:  Diagnosis Date   Arthritis    Glaucoma    Hyperlipemia    Hypertension     Patient Active Problem List   Diagnosis Date Noted   Essential hypertension 03/01/2018   Hyperlipidemia 03/01/2018   Chest pain 03/01/2018   Unstable angina (HCC) 02/27/2018   Fracture dislocation of left elbow joint 12/13/2013    Past Surgical History:  Procedure Laterality Date   ABDOMINAL HYSTERECTOMY     APPENDECTOMY     EYE SURGERY     LEFT HEART CATH AND CORONARY ANGIOGRAPHY N/A 02/28/2018   Procedure: LEFT HEART CATH AND CORONARY ANGIOGRAPHY;  Surgeon: Runell Gess, MD;  Location: [facility];  Service: Cardiovascular;  Laterality: N/A;   ORIF ELBOW FRACTURE Left 12/12/2013   Procedure: OPEN REDUCTION INTERNAL FIXATION (ORIF) ELBOW/OLECRANON FRACTURE;  Surgeon: Dominica Severin, MD;  Location: [facility];  Service: Orthopedics;  Laterality: Left;   TONSILLECTOMY      OB History   No  obstetric history on file.      Home Medications    Prior to Admission medications   Medication Sig Start Date End Date Taking? Authorizing Provider  escitalopram (LEXAPRO) 10 MG tablet Take 1 tablet by mouth daily. 02/13/22  Yes [provider]  HYDROcodone-acetaminophen (NORCO/VICODIN) 5-325 MG tablet Take 1-2 tablets by mouth every 6 (six) hours as needed. MDD =6 03/22/22  Yes Eustace Moore, MD  tiZANidine (ZANAFLEX) 4 MG tablet Take 1-2 tablets (4-8 mg total) by mouth every 6 (six) hours as needed for muscle spasms. 03/22/22  Yes Eustace Moore, MD  traZODone (DESYREL) 50 MG tablet Take 1 tablet by mouth at bedtime as needed. 03/02/22  Yes [provider]  acetaminophen (TYLENOL) 650 MG CR tablet Take 650 mg by mouth every 8 (eight) hours as needed for pain. Takes 2 in AM, 2 in PM    [provider]  alendronate (FOSAMAX) 70 MG tablet Take 70 mg by mouth once a week. Take with a full glass of water on an empty stomach.    [provider]  amLODipine (NORVASC) 5 MG tablet Take 5 mg by mouth daily.    [provider]  atorvastatin (LIPITOR) 20 MG tablet Take 1 tablet by mouth daily. 06/22/18  [provider]  bimatoprost (LUMIGAN) 0.03 % ophthalmic solution Place 1 drop into both eyes at bedtime.     [provider]  Calcium Carb-Cholecalciferol (CALTRATE 600+D3 PO) Take by mouth.    [provider]  diclofenac Sodium (VOLTAREN) 1 % GEL Apply topically 4 (four) times daily. PRN    [provider]  LUMIGAN 0.01 % SOLN SMARTSIG:In Eye(s) 12/03/20   [provider]  Magnesium 250 MG TABS magnesium    [provider]  Multiple Vitamins-Minerals (WOMENS MULTIVITAMIN PO) Women's Multivitamin    [provider]  olmesartan (BENICAR) 40 MG tablet Take 40 mg by mouth daily.    [provider]  polyethylene glycol (MIRALAX / GLYCOLAX) packet Take 17 g by mouth daily as needed  for mild constipation.     [provider]  pregabalin (LYRICA) 75 MG capsule Take 75 mg by mouth 2 (two) times daily.    [provider]  solifenacin (VESICARE) 5 MG tablet Take 2.5 mg by mouth daily.     [provider]  Turmeric (CURCUMIN 95 PO) Take by mouth.    [provider]    Family History Family History  Problem Relation Age of Onset   Hypertension Mother    Heart disease Mother    Hypertension Father     Social History Social History   Tobacco Use   Smoking status: Never   Smokeless tobacco: Never  Vaping Use   Vaping Use: Never used  Substance Use Topics   Alcohol use: No   Drug use: No     Allergies   Sulfa antibiotics   Review of Systems Review of Systems See HPI  Physical Exam Triage Vital Signs ED Triage Vitals  Enc Vitals Group     BP 03/22/22 1150 (!) 158/90     Pulse Rate 03/22/22 1150 62     Resp 03/22/22 1150 18     Temp 03/22/22 1150 98.7 F (37.1 C)     Temp Source 03/22/22 1150 Oral     SpO2 03/22/22 1150 98 %     Weight 03/22/22 1153 160 lb (72.6 kg)     Height 03/22/22 1153 5\' 4"  (1.626 m)     Head Circumference --      Peak Flow --      Pain Score 03/22/22 1153 9     Pain Loc --      Pain Edu? --      Excl. in GC? --    No data found.  Updated Vital Signs BP (!) 158/90 (BP Location: Right Arm)   Pulse 62   Temp 98.7 F (37.1 C) (Oral)   Resp 18   Ht 5\' 4"  (1.626 m)   Wt 72.6 kg   SpO2 98%   BMI 27.46 kg/m      Physical Exam Constitutional:      General: She is in acute distress.     Appearance: She is well-developed and normal weight.     Comments: Holding her right flank.  Resists movement  HENT:     Head: Normocephalic and atraumatic.     Right Ear: Tympanic membrane and ear canal normal.     Left Ear: Tympanic membrane and ear canal normal.     Nose: Rhinorrhea present.     Mouth/Throat:     Mouth: Mucous membranes are moist.     Pharynx: Posterior oropharyngeal  erythema present.  Eyes:     Conjunctiva/sclera: Conjunctivae normal.  Pupils: Pupils are equal, round, and reactive to light.  Cardiovascular:     Rate and Rhythm: Normal rate.  Pulmonary:     Effort: Pulmonary effort is normal. No respiratory distress.  Abdominal:     General: There is no distension.     Palpations: Abdomen is soft.  Musculoskeletal:        General: Tenderness present. Normal range of motion.     Cervical back: Normal range of motion.     Comments: Tenderness is localized to the right lumbar muscles.  No bony tenderness.  Neuro exam of lower extremities is symmetric  Lymphadenopathy:     Cervical: No cervical adenopathy.  Skin:    General: Skin is warm and dry.  Neurological:     Mental Status: She is alert.      UC Treatments / Results  Labs (all labs ordered are listed, but only abnormal results are displayed) Labs Reviewed - No data to display  EKG   Radiology No results found.  Procedures Procedures (including critical care time)  Medications Ordered in UC Medications  ketorolac (TORADOL) 30 MG/ML injection 30 mg (has no administration in time range)    Initial Impression / Assessment and Plan / UC Course  I have reviewed the triage vital signs and the nursing notes.  Pertinent labs & imaging results that were available during my care of the patient were reviewed by me and considered in my medical decision making (see chart for details).     Explained to patient had a viral upper respiratory infection that would not be improved with antibiotics Patient has a muscular back strain She has normal kidney function.  No contraindication to Toradol use Final Clinical Impressions(s) / UC Diagnoses   Final diagnoses:  Acute right-sided low back pain without sciatica  Viral URI with cough     Discharge Instructions      Take tylenol for moderate pain Take the tylenol with hydrocodone if pain is severe Continue the dosepak May use  tizanidine as needed as muscle relaxer Ice or heat to painful muscles Call your doctor if not improving in a few days    ED Prescriptions     Medication Sig Dispense Auth. Provider   tiZANidine (ZANAFLEX) 4 MG tablet Take 1-2 tablets (4-8 mg total) by mouth every 6 (six) hours as needed for muscle spasms. 21 tablet Eustace Moore, MD   HYDROcodone-acetaminophen (NORCO/VICODIN) 5-325 MG tablet Take 1-2 tablets by mouth every 6 (six) hours as needed. MDD =6 10 tablet Eustace Moore, MD      I have reviewed the PDMP during this encounter.   Eustace Moore, MD 03/22/22 1235    Eustace Moore, MD 03/22/22 1235

## 2022-03-26 ENCOUNTER — Ambulatory Visit: Admit: 2022-03-26 | Discharge status: Absent without leave | Payer: Medicare PPO

## 2022-03-26 ENCOUNTER — Ambulatory Visit
Admission: EM | Admit: 2022-03-26 | Discharge: 2022-03-26 | Disposition: A | Discharge status: Home / Self Care | Payer: Medicare PPO | Attending: Family Medicine | Admitting: Family Medicine

## 2022-03-26 DIAGNOSIS — J019 Acute sinusitis, unspecified: Secondary | ICD-10-CM

## 2022-03-26 DIAGNOSIS — B9689 Other specified bacterial agents as the cause of diseases classified elsewhere: Secondary | ICD-10-CM

## 2022-03-26 MED ORDER — AMOXICILLIN-POT CLAVULANATE 875-125 MG PO TABS: 1.0000 | ORAL_TABLET | Freq: Two times a day (BID) | ORAL | 0 refills | Status: DC

## 2022-03-26 MED ORDER — FLUTICASONE PROPIONATE 50 MCG/ACT NA SUSP: 2.0000 | Freq: Every day | NASAL | 0 refills | Status: AC

## 2022-03-26 NOTE — Discharge Instructions (Signed)
Use Flonase daily until your symptoms have gone Drink lots of water Take the Augmentin antibiotic 2 times a day Take a probiotic if it upsets your stomach See your doctor if not improving by next week

## 2022-03-26 NOTE — ED Provider Notes (Signed)
[facility]    CSN: [id number] Arrival date & time: 03/26/22  1216      History   Chief Complaint Chief Complaint  Patient presents with   Nasal Congestion   Facial Pain   Cough    HPI Deanna Compton is a 76 y.o. female.   HPI  I saw Deanna Compton several days ago for back pain.  At the time she stated she thought she was coming down with a sinus infection with postnasal drip and cough.  I told her it was a virus and recommended symptomatic care.  Unfortunately, her symptoms have worsened and now she has a purulent drainage, sore throat, coughing and chest congestion.  She is starting to feel tired.  She thinks that her infection is worse.  On a positive note her back pain is improved  Past Medical History:  Diagnosis Date   Arthritis    Glaucoma    Hyperlipemia    Hypertension     Patient Active Problem List   Diagnosis Date Noted   Essential hypertension 03/01/2018   Hyperlipidemia 03/01/2018   Chest pain 03/01/2018   Unstable angina (HCC) 02/27/2018   Fracture dislocation of left elbow joint 12/13/2013    Past Surgical History:  Procedure Laterality Date   ABDOMINAL HYSTERECTOMY     APPENDECTOMY     EYE SURGERY     LEFT HEART CATH AND CORONARY ANGIOGRAPHY N/A 02/28/2018   Procedure: LEFT HEART CATH AND CORONARY ANGIOGRAPHY;  Surgeon: Runell Gess, MD;  Location: [facility];  Service: Cardiovascular;  Laterality: N/A;   ORIF ELBOW FRACTURE Left 12/12/2013   Procedure: OPEN REDUCTION INTERNAL FIXATION (ORIF) ELBOW/OLECRANON FRACTURE;  Surgeon: Dominica Severin, MD;  Location: [facility] OR;  Service: Orthopedics;  Laterality: Left;   TONSILLECTOMY      OB History   No obstetric history on file.      Home Medications    Prior to Admission medications   Medication Sig Start Date End Date Taking? Authorizing Provider  amoxicillin-clavulanate (AUGMENTIN) 875-125 MG tablet Take 1 tablet by mouth every 12 (twelve) hours. 03/26/22  Yes Eustace Moore,  MD  fluticasone [facility]) 50 MCG/ACT nasal spray Place 2 sprays into both nostrils daily. 03/26/22  Yes Eustace Moore, MD  acetaminophen (TYLENOL) 650 MG CR tablet Take 650 mg by mouth every 8 (eight) hours as needed for pain. Takes 2 in AM, 2 in PM    [provider]  alendronate (FOSAMAX) 70 MG tablet Take 70 mg by mouth once a week. Take with a full glass of water on an empty stomach.    [provider]  amLODipine (NORVASC) 5 MG tablet Take 5 mg by mouth daily.    [provider]  atorvastatin (LIPITOR) 20 MG tablet Take 1 tablet by mouth daily. 06/22/18   [provider]  bimatoprost (LUMIGAN) 0.03 % ophthalmic solution Place 1 drop into both eyes at bedtime.     [provider]  Calcium Carb-Cholecalciferol (CALTRATE 600+D3 PO) Take by mouth.    [provider]  diclofenac Sodium (VOLTAREN) 1 % GEL Apply topically 4 (four) times daily. PRN    [provider]  escitalopram (LEXAPRO) 10 MG tablet Take 1 tablet by mouth daily. 02/13/22   [provider]  HYDROcodone-acetaminophen (NORCO/VICODIN) 5-325 MG tablet Take 1-2 tablets by mouth every 6 (six) hours as needed. MDD =6 03/22/22   Eustace Moore, MD  LUMIGAN 0.01 % SOLN SMARTSIG:In Eye(s) 12/03/20  [provider]  Magnesium 250 MG TABS magnesium    [provider]  Multiple Vitamins-Minerals (WOMENS MULTIVITAMIN PO) Women's Multivitamin    [provider]  olmesartan (BENICAR) 40 MG tablet Take 40 mg by mouth daily.    [provider]  polyethylene glycol (MIRALAX / GLYCOLAX) packet Take 17 g by mouth daily as needed for mild constipation.     [provider]  pregabalin (LYRICA) 75 MG capsule Take 75 mg by mouth 2 (two) times daily.    [provider]  solifenacin (VESICARE) 5 MG tablet Take 2.5 mg by mouth daily.     [provider]  tiZANidine (ZANAFLEX) 4 MG tablet Take 1-2 tablets (4-8 mg total)  by mouth every 6 (six) hours as needed for muscle spasms. 03/22/22   Eustace Moore, MD  traZODone (DESYREL) 50 MG tablet Take 1 tablet by mouth at bedtime as needed. 03/02/22   [provider]  Turmeric (CURCUMIN 95 PO) Take by mouth.    [provider]    Family History Family History  Problem Relation Age of Onset   Hypertension Mother    Heart disease Mother    Hypertension Father     Social History Social History   Tobacco Use   Smoking status: Never   Smokeless tobacco: Never  Vaping Use   Vaping Use: Never used  Substance Use Topics   Alcohol use: No   Drug use: No     Allergies   Sulfa antibiotics   Review of Systems Review of Systems  See HPI Physical Exam Triage Vital Signs ED Triage Vitals  Enc Vitals Group     BP 03/26/22 1249 135/65     Pulse Rate 03/26/22 1249 (!) 58     Resp 03/26/22 1249 18     Temp 03/26/22 1249 98.1 F (36.7 C)     Temp Source 03/26/22 1249 Oral     SpO2 03/26/22 1249 99 %     Weight --      Height --      Head Circumference --      Peak Flow --      Pain Score 03/26/22 1250 0     Pain Loc --      Pain Edu? --      Excl. in GC? --    No data found.  Updated Vital Signs BP 135/65 (BP Location: Left Arm)   Pulse (!) 58   Temp 98.1 F (36.7 C) (Oral)   Resp 18   SpO2 99%       Physical Exam Constitutional:      General: She is not in acute distress.    Appearance: Normal appearance. She is well-developed.  HENT:     Head: Normocephalic and atraumatic.     Right Ear: Tympanic membrane and ear canal normal.     Left Ear: Tympanic membrane and ear canal normal.     Nose: Congestion and rhinorrhea present.     Mouth/Throat:     Pharynx: Posterior oropharyngeal erythema present.     Comments: Facial sinuses are tender.  Yellow postnasal drip noted Eyes:     Conjunctiva/sclera: Conjunctivae normal.     Pupils: Pupils are equal, round, and reactive to light.  Cardiovascular:     Rate and  Rhythm: Normal rate and regular rhythm.     Heart sounds: Normal heart sounds.  Pulmonary:     Effort: Pulmonary effort is normal. No respiratory distress.  Breath sounds: Normal breath sounds.  Abdominal:     General: There is no distension.     Palpations: Abdomen is soft.  Musculoskeletal:        General: Normal range of motion.     Cervical back: Normal range of motion.  Lymphadenopathy:     Cervical: Cervical adenopathy present.  Skin:    General: Skin is warm and dry.  Neurological:     Mental Status: She is alert.      UC Treatments / Results  Labs (all labs ordered are listed, but only abnormal results are displayed) Labs Reviewed - No data to display  EKG   Radiology No results found.  Procedures Procedures (including critical care time)  Medications Ordered in UC Medications - No data to display  Initial Impression / Assessment and Plan / UC Course  I have reviewed the triage vital signs and the nursing notes.  Pertinent labs & imaging results that were available during my care of the patient were reviewed by me and considered in my medical decision making (see chart for details).     Final Clinical Impressions(s) / UC Diagnoses   Final diagnoses:  Acute bacterial sinusitis     Discharge Instructions      Use Flonase daily until your symptoms have gone Drink lots of water Take the Augmentin antibiotic 2 times a day Take a probiotic if it upsets your stomach See your doctor if not improving by next week   ED Prescriptions     Medication Sig Dispense Auth. Provider   fluticasone (FLONASE) 50 MCG/ACT nasal spray Place 2 sprays into both nostrils daily. 16 g Eustace Moore, MD   amoxicillin-clavulanate (AUGMENTIN) 875-125 MG tablet Take 1 tablet by mouth every 12 (twelve) hours. 14 tablet Eustace Moore, MD      PDMP not reviewed this encounter.   Eustace Moore, MD 03/26/22 [phone]

## 2022-03-26 NOTE — ED Triage Notes (Signed)
Pt c/o nasal congestion and facial pain x 1 week. Started with cough. Taking robitussin DM prn.

## 2022-04-02 IMAGING — DX DG CHEST 1V PORT
1 series · 1 of 1 positions shown · non-contrast
Comparison: Chest x-ray 02/27/2018.

CLINICAL DATA: Weakness.

EXAM:
PORTABLE CHEST 1 VIEW

[chest ap]
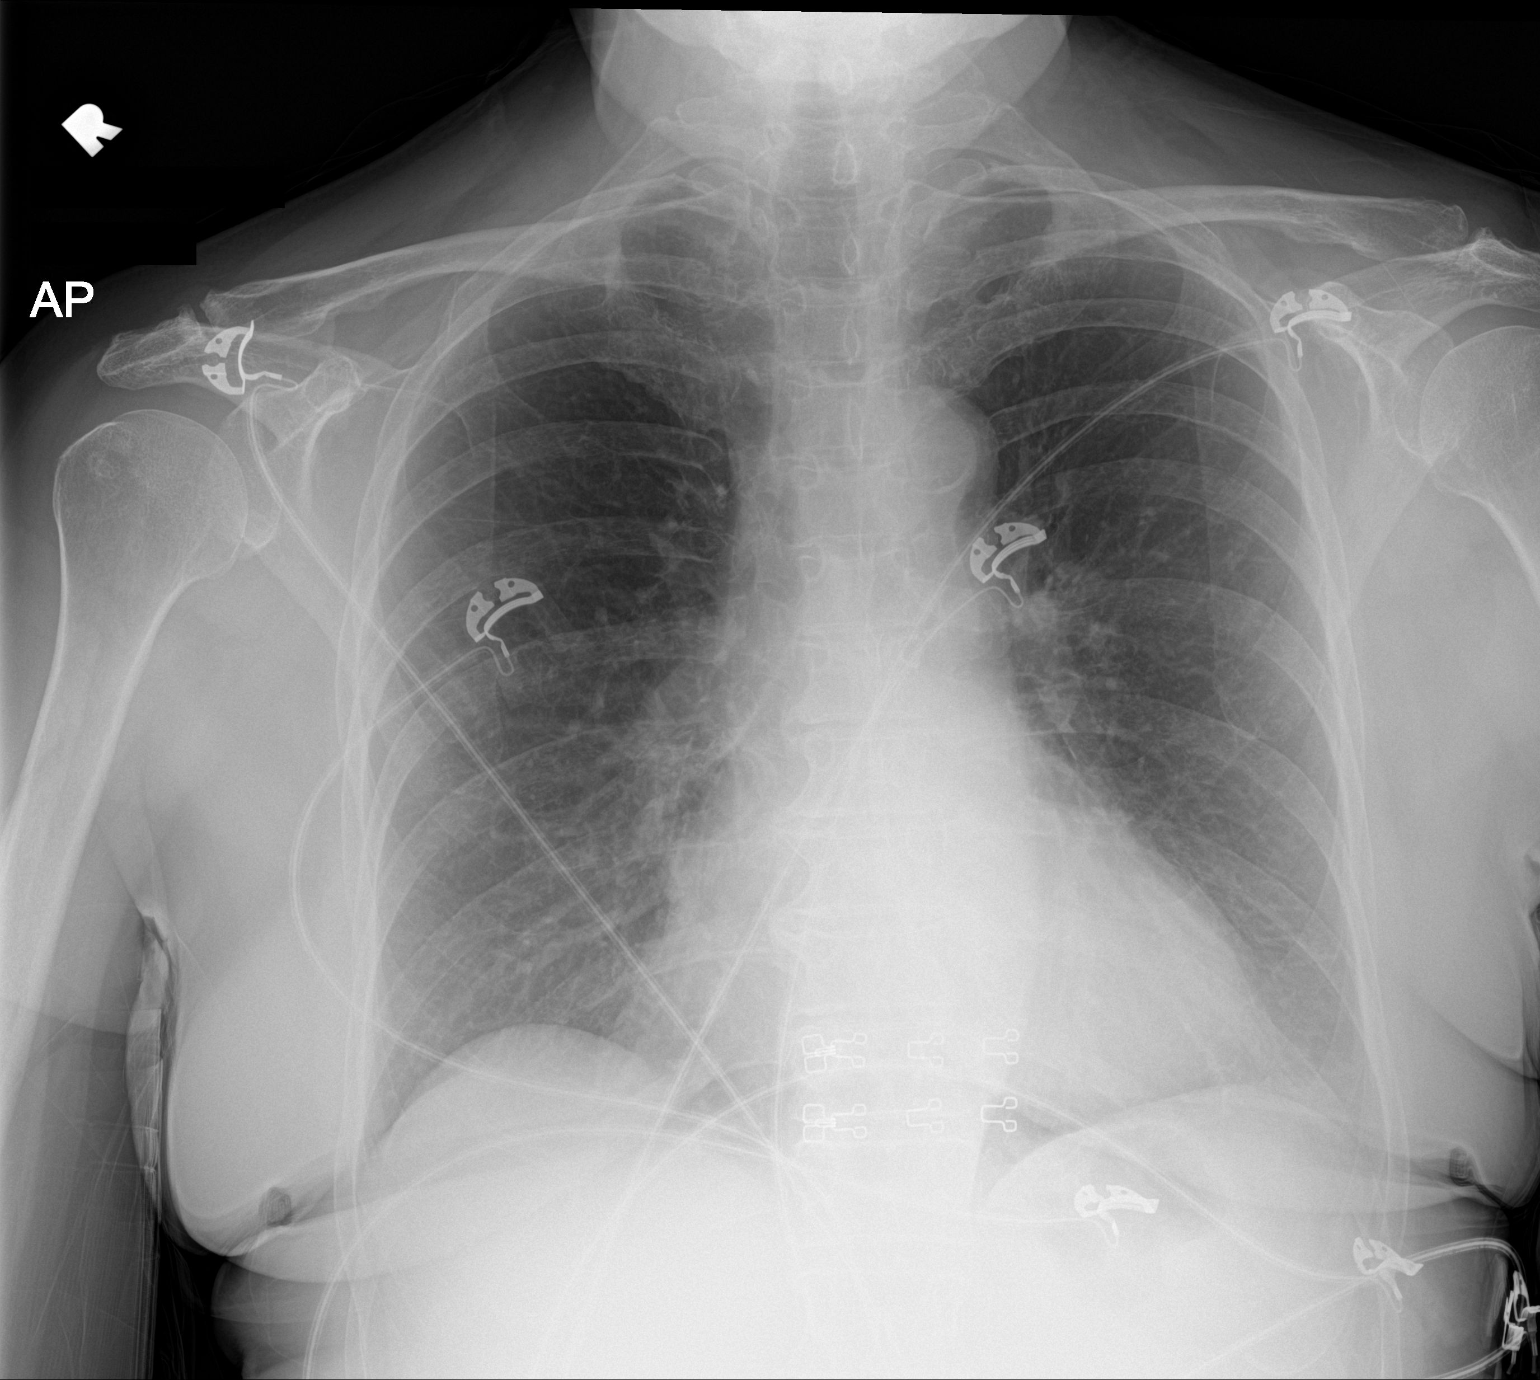

[1 of 1 positions shown; findings below may reference images not displayed]

FINDINGS: The heart size and mediastinal contours are within normal limits.
Both lungs are clear. The visualized skeletal structures are
unremarkable.
IMPRESSION: No active disease.

## 2022-04-08 ENCOUNTER — Ambulatory Visit
Admission: RE | Admit: 2022-04-08 | Discharge: 2022-04-08 | Disposition: A | Discharge status: Home / Self Care | Payer: Medicare PPO | Source: Ambulatory Visit | Attending: Family Medicine | Admitting: Family Medicine

## 2022-04-08 VITALS — BP 145/75 | HR 74 | Temp 98.9°F | Resp 14

## 2022-04-08 DIAGNOSIS — J069 Acute upper respiratory infection, unspecified: Secondary | ICD-10-CM | POA: Diagnosis not present

## 2022-04-08 MED ORDER — ONDANSETRON 4 MG PO TBDP: 4.0000 mg | ORAL_TABLET | Freq: Once | ORAL | Status: DC

## 2022-04-08 MED ORDER — PROMETHAZINE-DM 6.25-15 MG/5ML PO SYRP: 5.0000 mL | ORAL_SOLUTION | Freq: Four times a day (QID) | ORAL | 0 refills | Status: AC | PRN

## 2022-04-08 MED ORDER — ONDANSETRON 4 MG PO TBDP
4.0000 mg | ORAL_TABLET | Freq: Once | ORAL | Status: AC
  Administered 2022-04-08: 4 mg via ORAL

## 2022-04-08 NOTE — ED Triage Notes (Signed)
Pt presents with c/o cough and diarrhea that began Sunday.

## 2022-04-08 NOTE — ED Provider Notes (Signed)
[facility]    CSN: [id number] Arrival date & time: 04/08/22  1018      History   Chief Complaint Chief Complaint  Patient presents with   Cough    Fever 99.8  Completed meds from a recent treatment, last week. - Entered by patient   Diarrhea    HPI Deanna Compton is a 76 y.o. female.   HPI \\Patient  was seen on 03/26/2022 and thought to have maxillary sinusitis.  She was treated with a week of Augmentin.  She did better.  She states she was well for several days.  Now she has a cough for the last 3 days.  She states that her chest hurts from coughing.  She is not coughing anything up.  The cough is keeping her from sleeping well.  Denies fever or chills, headache or body ache.  They did COVID test at home yesterday and the day before and they were both negative.  Yesterday she developed diarrhea.  She has had 4 loose stools today.  She has had nausea but no vomiting.  Has taken Robitussin for the symptoms  Past Medical History:  Diagnosis Date   Arthritis    Glaucoma    Hyperlipemia    Hypertension     Patient Active Problem List   Diagnosis Date Noted   Essential hypertension 03/01/2018   Hyperlipidemia 03/01/2018   Chest pain 03/01/2018   Unstable angina (HCC) 02/27/2018   Fracture dislocation of left elbow joint 12/13/2013    Past Surgical History:  Procedure Laterality Date   ABDOMINAL HYSTERECTOMY     APPENDECTOMY     EYE SURGERY     LEFT HEART CATH AND CORONARY ANGIOGRAPHY N/A 02/28/2018   Procedure: LEFT HEART CATH AND CORONARY ANGIOGRAPHY;  Surgeon: Runell Gess, MD;  Location: [facility];  Service: Cardiovascular;  Laterality: N/A;   ORIF ELBOW FRACTURE Left 12/12/2013   Procedure: OPEN REDUCTION INTERNAL FIXATION (ORIF) ELBOW/OLECRANON FRACTURE;  Surgeon: Dominica Severin, MD;  Location: [facility] OR;  Service: Orthopedics;  Laterality: Left;   TONSILLECTOMY      OB History   No obstetric history on file.      Home Medications    Prior  to Admission medications   Medication Sig Start Date End Date Taking? Authorizing Provider  promethazine-dextromethorphan (PROMETHAZINE-DM) 6.25-15 MG/5ML syrup Take 5 mLs by mouth 4 (four) times daily as needed for cough. 04/08/22  Yes Eustace Moore, MD  acetaminophen (TYLENOL) 650 MG CR tablet Take 650 mg by mouth every 8 (eight) hours as needed for pain. Takes 2 in AM, 2 in PM    [provider]  alendronate (FOSAMAX) 70 MG tablet Take 70 mg by mouth once a week. Take with a full glass of water on an empty stomach.    [provider]  amLODipine (NORVASC) 5 MG tablet Take 5 mg by mouth daily.    [provider]  atorvastatin (LIPITOR) 20 MG tablet Take 1 tablet by mouth daily. 06/22/18   [provider]  bimatoprost (LUMIGAN) 0.03 % ophthalmic solution Place 1 drop into both eyes at bedtime.     [provider]  Calcium Carb-Cholecalciferol (CALTRATE 600+D3 PO) Take by mouth.    [provider]  diclofenac Sodium (VOLTAREN) 1 % GEL Apply topically 4 (four) times daily. PRN    [provider]  escitalopram (LEXAPRO) 10 MG tablet Take 1 tablet by mouth daily. 02/13/22   [provider]  fluticasone (FLONASE) 50  MCG/ACT nasal spray Place 2 sprays into both nostrils daily. 03/26/22   Eustace Moore, MD  HYDROcodone-acetaminophen (NORCO/VICODIN) 5-325 MG tablet Take 1-2 tablets by mouth every 6 (six) hours as needed. MDD =6 03/22/22   Eustace Moore, MD  LUMIGAN 0.01 % SOLN SMARTSIG:In Eye(s) 12/03/20   [provider]  Magnesium 250 MG TABS magnesium    [provider]  Multiple Vitamins-Minerals (WOMENS MULTIVITAMIN PO) Women's Multivitamin    [provider]  olmesartan (BENICAR) 40 MG tablet Take 40 mg by mouth daily.    [provider]  polyethylene glycol (MIRALAX / GLYCOLAX) packet Take 17 g by mouth daily as needed for mild constipation.     [provider]   pregabalin (LYRICA) 75 MG capsule Take 75 mg by mouth 2 (two) times daily.    [provider]  solifenacin (VESICARE) 5 MG tablet Take 2.5 mg by mouth daily.     [provider]  tiZANidine (ZANAFLEX) 4 MG tablet Take 1-2 tablets (4-8 mg total) by mouth every 6 (six) hours as needed for muscle spasms. 03/22/22   Eustace Moore, MD  traZODone (DESYREL) 50 MG tablet Take 1 tablet by mouth at bedtime as needed. 03/02/22   [provider]  Turmeric (CURCUMIN 95 PO) Take by mouth.    [provider]    Family History Family History  Problem Relation Age of Onset   Hypertension Mother    Heart disease Mother    Hypertension Father     Social History Social History   Tobacco Use   Smoking status: Never   Smokeless tobacco: Never  Vaping Use   Vaping Use: Never used  Substance Use Topics   Alcohol use: No   Drug use: No     Allergies   Sulfa antibiotics   Review of Systems Review of Systems See HPI  Physical Exam Triage Vital Signs ED Triage Vitals  Enc Vitals Group     BP 04/08/22 1031 (!) 145/75     Pulse Rate 04/08/22 1031 74     Resp 04/08/22 1031 14     Temp 04/08/22 1031 98.9 F (37.2 C)     Temp Source 04/08/22 1031 Oral     SpO2 04/08/22 1031 97 %     Weight --      Height --      Head Circumference --      Peak Flow --      Pain Score 04/08/22 1032 0     Pain Loc --      Pain Edu? --      Excl. in GC? --    No data found.  Updated Vital Signs BP (!) 145/75 (BP Location: Right Arm)   Pulse 74   Temp 98.9 F (37.2 C) (Oral)   Resp 14   SpO2 97%   Visual Acuity Right Eye Distance:   Left Eye Distance:   Bilateral Distance:    Right Eye Near:   Left Eye Near:    Bilateral Near:     Physical Exam Constitutional:      General: She is not in acute distress.    Appearance: She is well-developed. She is ill-appearing.  HENT:     Head: Normocephalic and atraumatic.     Right Ear: Tympanic membrane and  ear canal normal.     Left Ear: Tympanic membrane and ear canal normal.     Nose: Nose normal.     Mouth/Throat:  Mouth: Mucous membranes are moist.     Pharynx: No posterior oropharyngeal erythema.  Eyes:     Conjunctiva/sclera: Conjunctivae normal.     Pupils: Pupils are equal, round, and reactive to light.  Cardiovascular:     Rate and Rhythm: Normal rate and regular rhythm.     Heart sounds: Murmur heard.  Pulmonary:     Effort: Pulmonary effort is normal. No respiratory distress.     Breath sounds: No wheezing, rhonchi or rales.  Abdominal:     General: There is no distension.     Palpations: Abdomen is soft.  Musculoskeletal:        General: Normal range of motion.     Cervical back: Normal range of motion.  Skin:    General: Skin is warm and dry.  Neurological:     Mental Status: She is alert.     Gait: Gait normal.      UC Treatments / Results  Labs (all labs ordered are listed, but only abnormal results are displayed) Labs Reviewed - No data to display  EKG   Radiology No results found.  Procedures Procedures (including critical care time)  Medications Ordered in UC Medications  ondansetron (ZOFRAN-ODT) disintegrating tablet 4 mg (4 mg Oral Given 04/08/22 1049)    Initial Impression / Assessment and Plan / UC Course  I have reviewed the triage vital signs and the nursing notes.  Pertinent labs & imaging results that were available during my care of the patient were reviewed by me and considered in my medical decision making (see chart for details).     Patient is provided a stronger cough medicine at her request to help sleep.  I explained that I do not think another antibiotic is going to help her, this is most likely a viral infection. Final Clinical Impressions(s) / UC Diagnoses   Final diagnoses:  Viral URI with cough     Discharge Instructions      Take the Phenergan DM for cough.  You may take this 4 times a day.  It would likely  cause drowsiness.  It will help with nausea. Drink lots of fluids See your doctor if not improving by next week     ED Prescriptions     Medication Sig Dispense Auth. Provider   promethazine-dextromethorphan (PROMETHAZINE-DM) 6.25-15 MG/5ML syrup Take 5 mLs by mouth 4 (four) times daily as needed for cough. 118 mL Eustace Moore, MD      PDMP not reviewed this encounter.   Eustace Moore, MD 04/08/22 1102

## 2022-04-08 NOTE — Discharge Instructions (Signed)
Take the Phenergan DM for cough.  You may take this 4 times a day.  It would likely cause drowsiness.  It will help with nausea. Drink lots of fluids See your doctor if not improving by next week

## 2022-11-06 ENCOUNTER — Other Ambulatory Visit (HOSPITAL_COMMUNITY): Payer: Self-pay | Admitting: Orthopedic Surgery

## 2022-11-06 DIAGNOSIS — Z96651 Presence of right artificial knee joint: Secondary | ICD-10-CM

## 2022-11-16 ENCOUNTER — Encounter (HOSPITAL_COMMUNITY)
Admission: RE | Admit: 2022-11-16 | Discharge: 2022-11-16 | Disposition: A | Discharge status: Home / Self Care | Payer: Medicare PPO | Source: Ambulatory Visit | Attending: Orthopedic Surgery | Admitting: Orthopedic Surgery

## 2022-11-16 DIAGNOSIS — Z96651 Presence of right artificial knee joint: Secondary | ICD-10-CM | POA: Diagnosis present

## 2022-11-16 MED ORDER — TECHNETIUM TC 99M MEDRONATE IV KIT: 19.5000 | PACK | Freq: Once | INTRAVENOUS | Status: DC | PRN

## 2022-11-16 MED ORDER — TECHNETIUM TC 99M MEDRONATE IV KIT
19.5000 | PACK | Freq: Once | INTRAVENOUS | Status: AC | PRN
  Administered 2022-11-16: 19.5 via INTRAVENOUS
